# Patient Record
Sex: Female | Born: 1950 | Race: White | Hispanic: No
Health system: Southern US, Community
[De-identification: ages and names within clinical notes are randomized; demographics above are authoritative.]

---

## 2017-04-18 DIAGNOSIS — S32592A Other specified fracture of left pubis, initial encounter for closed fracture: Secondary | ICD-10-CM

## 2017-04-18 DIAGNOSIS — W19XXXA Unspecified fall, initial encounter: Secondary | ICD-10-CM

## 2017-04-19 DIAGNOSIS — Z720 Tobacco use: Secondary | ICD-10-CM

## 2017-04-24 DIAGNOSIS — Z720 Tobacco use: Secondary | ICD-10-CM | POA: Diagnosis not present

## 2017-04-24 DIAGNOSIS — S32592A Other specified fracture of left pubis, initial encounter for closed fracture: Secondary | ICD-10-CM | POA: Diagnosis not present

## 2017-04-24 DIAGNOSIS — W19XXXA Unspecified fall, initial encounter: Secondary | ICD-10-CM | POA: Diagnosis not present

## 2017-04-27 DIAGNOSIS — J449 Chronic obstructive pulmonary disease, unspecified: Secondary | ICD-10-CM | POA: Diagnosis not present

## 2017-04-27 DIAGNOSIS — R262 Difficulty in walking, not elsewhere classified: Secondary | ICD-10-CM

## 2017-04-27 DIAGNOSIS — N390 Urinary tract infection, site not specified: Secondary | ICD-10-CM

## 2017-04-27 DIAGNOSIS — S32592A Other specified fracture of left pubis, initial encounter for closed fracture: Secondary | ICD-10-CM | POA: Diagnosis not present

## 2017-04-27 DIAGNOSIS — Z720 Tobacco use: Secondary | ICD-10-CM | POA: Diagnosis not present

## 2017-04-27 DIAGNOSIS — S329XXA Fracture of unspecified parts of lumbosacral spine and pelvis, initial encounter for closed fracture: Secondary | ICD-10-CM

## 2017-04-28 DIAGNOSIS — Z720 Tobacco use: Secondary | ICD-10-CM | POA: Diagnosis not present

## 2017-04-28 DIAGNOSIS — S32592A Other specified fracture of left pubis, initial encounter for closed fracture: Secondary | ICD-10-CM | POA: Diagnosis not present

## 2017-04-28 DIAGNOSIS — S329XXA Fracture of unspecified parts of lumbosacral spine and pelvis, initial encounter for closed fracture: Secondary | ICD-10-CM | POA: Diagnosis not present

## 2017-04-28 DIAGNOSIS — J449 Chronic obstructive pulmonary disease, unspecified: Secondary | ICD-10-CM | POA: Diagnosis not present

## 2017-04-28 DIAGNOSIS — R262 Difficulty in walking, not elsewhere classified: Secondary | ICD-10-CM | POA: Diagnosis not present

## 2017-04-28 DIAGNOSIS — N390 Urinary tract infection, site not specified: Secondary | ICD-10-CM | POA: Diagnosis not present

## 2017-04-29 DIAGNOSIS — J449 Chronic obstructive pulmonary disease, unspecified: Secondary | ICD-10-CM | POA: Diagnosis not present

## 2017-04-29 DIAGNOSIS — R262 Difficulty in walking, not elsewhere classified: Secondary | ICD-10-CM | POA: Diagnosis not present

## 2017-04-29 DIAGNOSIS — N390 Urinary tract infection, site not specified: Secondary | ICD-10-CM | POA: Diagnosis not present

## 2017-04-29 DIAGNOSIS — S329XXA Fracture of unspecified parts of lumbosacral spine and pelvis, initial encounter for closed fracture: Secondary | ICD-10-CM | POA: Diagnosis not present

## 2017-04-30 DIAGNOSIS — R262 Difficulty in walking, not elsewhere classified: Secondary | ICD-10-CM | POA: Diagnosis not present

## 2017-04-30 DIAGNOSIS — S329XXA Fracture of unspecified parts of lumbosacral spine and pelvis, initial encounter for closed fracture: Secondary | ICD-10-CM | POA: Diagnosis not present

## 2017-04-30 DIAGNOSIS — J449 Chronic obstructive pulmonary disease, unspecified: Secondary | ICD-10-CM | POA: Diagnosis not present

## 2017-04-30 DIAGNOSIS — N390 Urinary tract infection, site not specified: Secondary | ICD-10-CM | POA: Diagnosis not present

## 2017-05-01 DIAGNOSIS — R262 Difficulty in walking, not elsewhere classified: Secondary | ICD-10-CM | POA: Diagnosis not present

## 2017-05-01 DIAGNOSIS — S329XXA Fracture of unspecified parts of lumbosacral spine and pelvis, initial encounter for closed fracture: Secondary | ICD-10-CM | POA: Diagnosis not present

## 2017-05-01 DIAGNOSIS — N390 Urinary tract infection, site not specified: Secondary | ICD-10-CM | POA: Diagnosis not present

## 2017-05-01 DIAGNOSIS — J449 Chronic obstructive pulmonary disease, unspecified: Secondary | ICD-10-CM | POA: Diagnosis not present

## 2017-05-02 DIAGNOSIS — S329XXA Fracture of unspecified parts of lumbosacral spine and pelvis, initial encounter for closed fracture: Secondary | ICD-10-CM | POA: Diagnosis not present

## 2017-05-02 DIAGNOSIS — N390 Urinary tract infection, site not specified: Secondary | ICD-10-CM | POA: Diagnosis not present

## 2017-05-02 DIAGNOSIS — J449 Chronic obstructive pulmonary disease, unspecified: Secondary | ICD-10-CM | POA: Diagnosis not present

## 2017-05-02 DIAGNOSIS — R262 Difficulty in walking, not elsewhere classified: Secondary | ICD-10-CM | POA: Diagnosis not present

## 2017-05-24 DIAGNOSIS — N179 Acute kidney failure, unspecified: Secondary | ICD-10-CM

## 2017-05-24 DIAGNOSIS — S329XXA Fracture of unspecified parts of lumbosacral spine and pelvis, initial encounter for closed fracture: Secondary | ICD-10-CM

## 2017-05-24 DIAGNOSIS — E860 Dehydration: Secondary | ICD-10-CM | POA: Diagnosis not present

## 2017-05-24 DIAGNOSIS — J449 Chronic obstructive pulmonary disease, unspecified: Secondary | ICD-10-CM | POA: Diagnosis not present

## 2017-05-25 DIAGNOSIS — S329XXA Fracture of unspecified parts of lumbosacral spine and pelvis, initial encounter for closed fracture: Secondary | ICD-10-CM | POA: Diagnosis not present

## 2017-05-25 DIAGNOSIS — J449 Chronic obstructive pulmonary disease, unspecified: Secondary | ICD-10-CM | POA: Diagnosis not present

## 2017-05-25 DIAGNOSIS — E860 Dehydration: Secondary | ICD-10-CM | POA: Diagnosis not present

## 2017-05-25 DIAGNOSIS — I6789 Other cerebrovascular disease: Secondary | ICD-10-CM

## 2017-05-25 DIAGNOSIS — N179 Acute kidney failure, unspecified: Secondary | ICD-10-CM | POA: Diagnosis not present

## 2017-05-26 ENCOUNTER — Inpatient Hospital Stay (HOSPITAL_COMMUNITY)
Admission: AD | Admit: 2017-05-26 | Discharge: 2017-06-08 | DRG: 064 | Disposition: E | Payer: Medicaid Other | Source: Other Acute Inpatient Hospital | Attending: Family Medicine | Admitting: Family Medicine

## 2017-05-26 ENCOUNTER — Inpatient Hospital Stay (HOSPITAL_COMMUNITY): Payer: Medicaid Other

## 2017-05-26 DIAGNOSIS — A419 Sepsis, unspecified organism: Secondary | ICD-10-CM | POA: Diagnosis not present

## 2017-05-26 DIAGNOSIS — B182 Chronic viral hepatitis C: Secondary | ICD-10-CM | POA: Diagnosis not present

## 2017-05-26 DIAGNOSIS — J9600 Acute respiratory failure, unspecified whether with hypoxia or hypercapnia: Secondary | ICD-10-CM | POA: Diagnosis not present

## 2017-05-26 DIAGNOSIS — J690 Pneumonitis due to inhalation of food and vomit: Secondary | ICD-10-CM | POA: Diagnosis present

## 2017-05-26 DIAGNOSIS — E440 Moderate protein-calorie malnutrition: Secondary | ICD-10-CM | POA: Diagnosis present

## 2017-05-26 DIAGNOSIS — J9601 Acute respiratory failure with hypoxia: Secondary | ICD-10-CM | POA: Diagnosis not present

## 2017-05-26 DIAGNOSIS — R6521 Severe sepsis with septic shock: Secondary | ICD-10-CM | POA: Diagnosis not present

## 2017-05-26 DIAGNOSIS — I6340 Cerebral infarction due to embolism of unspecified cerebral artery: Principal | ICD-10-CM | POA: Diagnosis present

## 2017-05-26 DIAGNOSIS — Z9911 Dependence on respirator [ventilator] status: Secondary | ICD-10-CM

## 2017-05-26 DIAGNOSIS — I330 Acute and subacute infective endocarditis: Secondary | ICD-10-CM | POA: Diagnosis present

## 2017-05-26 DIAGNOSIS — I76 Septic arterial embolism: Secondary | ICD-10-CM | POA: Diagnosis present

## 2017-05-26 DIAGNOSIS — I059 Rheumatic mitral valve disease, unspecified: Secondary | ICD-10-CM | POA: Diagnosis not present

## 2017-05-26 DIAGNOSIS — E876 Hypokalemia: Secondary | ICD-10-CM | POA: Diagnosis not present

## 2017-05-26 DIAGNOSIS — I669 Occlusion and stenosis of unspecified cerebral artery: Secondary | ICD-10-CM | POA: Diagnosis not present

## 2017-05-26 DIAGNOSIS — R29724 NIHSS score 24: Secondary | ICD-10-CM | POA: Diagnosis present

## 2017-05-26 DIAGNOSIS — T17990A Other foreign object in respiratory tract, part unspecified in causing asphyxiation, initial encounter: Secondary | ICD-10-CM | POA: Diagnosis present

## 2017-05-26 DIAGNOSIS — N179 Acute kidney failure, unspecified: Secondary | ICD-10-CM | POA: Diagnosis present

## 2017-05-26 DIAGNOSIS — E872 Acidosis: Secondary | ICD-10-CM | POA: Diagnosis not present

## 2017-05-26 DIAGNOSIS — I6310 Cerebral infarction due to embolism of unspecified precerebral artery: Secondary | ICD-10-CM | POA: Diagnosis not present

## 2017-05-26 DIAGNOSIS — I639 Cerebral infarction, unspecified: Secondary | ICD-10-CM | POA: Diagnosis not present

## 2017-05-26 DIAGNOSIS — E46 Unspecified protein-calorie malnutrition: Secondary | ICD-10-CM | POA: Diagnosis not present

## 2017-05-26 DIAGNOSIS — K219 Gastro-esophageal reflux disease without esophagitis: Secondary | ICD-10-CM | POA: Diagnosis not present

## 2017-05-26 DIAGNOSIS — S329XXA Fracture of unspecified parts of lumbosacral spine and pelvis, initial encounter for closed fracture: Secondary | ICD-10-CM | POA: Diagnosis not present

## 2017-05-26 DIAGNOSIS — K7460 Unspecified cirrhosis of liver: Secondary | ICD-10-CM | POA: Diagnosis not present

## 2017-05-26 DIAGNOSIS — K567 Ileus, unspecified: Secondary | ICD-10-CM | POA: Diagnosis not present

## 2017-05-26 DIAGNOSIS — B9561 Methicillin susceptible Staphylococcus aureus infection as the cause of diseases classified elsewhere: Secondary | ICD-10-CM | POA: Diagnosis not present

## 2017-05-26 DIAGNOSIS — R64 Cachexia: Secondary | ICD-10-CM | POA: Diagnosis not present

## 2017-05-26 DIAGNOSIS — J9690 Respiratory failure, unspecified, unspecified whether with hypoxia or hypercapnia: Secondary | ICD-10-CM

## 2017-05-26 DIAGNOSIS — D649 Anemia, unspecified: Secondary | ICD-10-CM | POA: Diagnosis not present

## 2017-05-26 DIAGNOSIS — G8191 Hemiplegia, unspecified affecting right dominant side: Secondary | ICD-10-CM

## 2017-05-26 DIAGNOSIS — J449 Chronic obstructive pulmonary disease, unspecified: Secondary | ICD-10-CM | POA: Diagnosis present

## 2017-05-26 DIAGNOSIS — Z66 Do not resuscitate: Secondary | ICD-10-CM | POA: Diagnosis present

## 2017-05-26 DIAGNOSIS — I38 Endocarditis, valve unspecified: Secondary | ICD-10-CM

## 2017-05-26 DIAGNOSIS — A4101 Sepsis due to Methicillin susceptible Staphylococcus aureus: Secondary | ICD-10-CM

## 2017-05-26 DIAGNOSIS — D696 Thrombocytopenia, unspecified: Secondary | ICD-10-CM

## 2017-05-26 DIAGNOSIS — Z515 Encounter for palliative care: Secondary | ICD-10-CM | POA: Diagnosis present

## 2017-05-26 DIAGNOSIS — R140 Abdominal distension (gaseous): Secondary | ICD-10-CM

## 2017-05-26 DIAGNOSIS — R233 Spontaneous ecchymoses: Secondary | ICD-10-CM | POA: Diagnosis not present

## 2017-05-26 DIAGNOSIS — R7881 Bacteremia: Secondary | ICD-10-CM | POA: Diagnosis not present

## 2017-05-26 DIAGNOSIS — I6789 Other cerebrovascular disease: Secondary | ICD-10-CM | POA: Diagnosis not present

## 2017-05-26 DIAGNOSIS — B1920 Unspecified viral hepatitis C without hepatic coma: Secondary | ICD-10-CM | POA: Diagnosis not present

## 2017-05-26 DIAGNOSIS — R609 Edema, unspecified: Secondary | ICD-10-CM | POA: Diagnosis not present

## 2017-05-26 DIAGNOSIS — J189 Pneumonia, unspecified organism: Secondary | ICD-10-CM | POA: Diagnosis not present

## 2017-05-26 DIAGNOSIS — Y95 Nosocomial condition: Secondary | ICD-10-CM | POA: Diagnosis present

## 2017-05-26 DIAGNOSIS — R931 Abnormal findings on diagnostic imaging of heart and coronary circulation: Secondary | ICD-10-CM | POA: Diagnosis not present

## 2017-05-26 DIAGNOSIS — I10 Essential (primary) hypertension: Secondary | ICD-10-CM | POA: Diagnosis present

## 2017-05-26 DIAGNOSIS — E860 Dehydration: Secondary | ICD-10-CM | POA: Diagnosis present

## 2017-05-26 DIAGNOSIS — D72829 Elevated white blood cell count, unspecified: Secondary | ICD-10-CM

## 2017-05-26 DIAGNOSIS — G9341 Metabolic encephalopathy: Secondary | ICD-10-CM | POA: Diagnosis present

## 2017-05-26 DIAGNOSIS — Z6822 Body mass index (BMI) 22.0-22.9, adult: Secondary | ICD-10-CM

## 2017-05-26 DIAGNOSIS — R6520 Severe sepsis without septic shock: Secondary | ICD-10-CM

## 2017-05-26 LAB — PREPARE RBC (CROSSMATCH)

## 2017-05-26 LAB — CBC WITH DIFFERENTIAL/PLATELET
Basophils Absolute: 0 10*3/uL (ref 0.0–0.1)
Basophils Relative: 0 %
EOS ABS: 0 10*3/uL (ref 0.0–0.7)
EOS PCT: 0 %
HCT: 20 % — ABNORMAL LOW (ref 36.0–46.0)
HEMOGLOBIN: 6.4 g/dL — AB (ref 12.0–15.0)
LYMPHS ABS: 1.4 10*3/uL (ref 0.7–4.0)
Lymphocytes Relative: 6 %
MCH: 26.8 pg (ref 26.0–34.0)
MCHC: 32 g/dL (ref 30.0–36.0)
MCV: 83.7 fL (ref 78.0–100.0)
Monocytes Absolute: 1.6 10*3/uL — ABNORMAL HIGH (ref 0.1–1.0)
Monocytes Relative: 7 %
NEUTROS ABS: 19.7 10*3/uL — AB (ref 1.7–7.7)
NRBC: 1 /100{WBCs} — AB
Neutrophils Relative %: 87 %
Platelets: 33 10*3/uL — ABNORMAL LOW (ref 150–400)
RBC: 2.39 MIL/uL — AB (ref 3.87–5.11)
RDW: 28.7 % — ABNORMAL HIGH (ref 11.5–15.5)
WBC: 22.7 10*3/uL — AB (ref 4.0–10.5)

## 2017-05-26 LAB — COMPREHENSIVE METABOLIC PANEL
ALT: 25 U/L (ref 14–54)
ANION GAP: 6 (ref 5–15)
AST: 48 U/L — ABNORMAL HIGH (ref 15–41)
Albumin: 1.5 g/dL — ABNORMAL LOW (ref 3.5–5.0)
Alkaline Phosphatase: 129 U/L — ABNORMAL HIGH (ref 38–126)
BUN: 54 mg/dL — ABNORMAL HIGH (ref 6–20)
CHLORIDE: 118 mmol/L — AB (ref 101–111)
CO2: 13 mmol/L — AB (ref 22–32)
Calcium: 7.5 mg/dL — ABNORMAL LOW (ref 8.9–10.3)
Creatinine, Ser: 1.4 mg/dL — ABNORMAL HIGH (ref 0.44–1.00)
GFR, EST AFRICAN AMERICAN: 45 mL/min — AB (ref >60)
GFR, EST NON AFRICAN AMERICAN: 39 mL/min — AB (ref >60)
Glucose, Bld: 95 mg/dL (ref 65–99)
Potassium: 3.5 mmol/L (ref 3.5–5.1)
SODIUM: 137 mmol/L (ref 135–145)
Total Bilirubin: 1.1 mg/dL (ref 0.3–1.2)
Total Protein: 5.4 g/dL — ABNORMAL LOW (ref 6.5–8.1)

## 2017-05-26 LAB — BLOOD GAS, ARTERIAL
Acid-base deficit: 11.2 mmol/L — ABNORMAL HIGH (ref 0.0–2.0)
BICARBONATE: 14.2 mmol/L — AB (ref 20.0–28.0)
Drawn by: 519031
FIO2: 40
O2 Saturation: 95.4 %
PCO2 ART: 31.3 mmHg — AB (ref 32.0–48.0)
PEEP: 5 cmH2O
Patient temperature: 98.6
RATE: 18 resp/min
VT: 500 mL
pH, Arterial: 7.278 — ABNORMAL LOW (ref 7.350–7.450)
pO2, Arterial: 88.7 mmHg (ref 83.0–108.0)

## 2017-05-26 LAB — PROTIME-INR
INR: 1.85
PROTHROMBIN TIME: 21.2 s — AB (ref 11.4–15.2)

## 2017-05-26 LAB — TRIGLYCERIDES: Triglycerides: 124 mg/dL (ref <150)

## 2017-05-26 LAB — LIPID PANEL
CHOLESTEROL: 51 mg/dL (ref 0–200)
HDL: <10 mg/dL — ABNORMAL LOW (ref >40)
TRIGLYCERIDES: 157 mg/dL — AB (ref <150)
VLDL: 31 mg/dL (ref 0–40)

## 2017-05-26 LAB — ABO/RH: ABO/RH(D): O POS

## 2017-05-26 LAB — URINALYSIS, ROUTINE W REFLEX MICROSCOPIC
BILIRUBIN URINE: NEGATIVE
Glucose, UA: NEGATIVE mg/dL
KETONES UR: NEGATIVE mg/dL
NITRITE: NEGATIVE
PROTEIN: NEGATIVE mg/dL
Specific Gravity, Urine: 1.013 (ref 1.005–1.030)
pH: 5 (ref 5.0–8.0)

## 2017-05-26 LAB — MRSA PCR SCREENING: MRSA by PCR: NEGATIVE

## 2017-05-26 LAB — LACTIC ACID, PLASMA
LACTIC ACID, VENOUS: 0.9 mmol/L (ref 0.5–1.9)
Lactic Acid, Venous: 0.8 mmol/L (ref 0.5–1.9)

## 2017-05-26 LAB — MAGNESIUM: MAGNESIUM: 1.6 mg/dL — AB (ref 1.7–2.4)

## 2017-05-26 LAB — PROCALCITONIN: PROCALCITONIN: 11.49 ng/mL

## 2017-05-26 LAB — FIBRINOGEN: Fibrinogen: 428 mg/dL (ref 210–475)

## 2017-05-26 LAB — PHOSPHORUS: PHOSPHORUS: 3.4 mg/dL (ref 2.5–4.6)

## 2017-05-26 MED ORDER — PIPERACILLIN-TAZOBACTAM 3.375 G IVPB
3.3750 g | Freq: Three times a day (TID) | INTRAVENOUS | Status: DC
  Administered 2017-05-26 – 2017-05-28 (×4): 3.375 g via INTRAVENOUS
  Filled 2017-05-26 (×6): qty 50

## 2017-05-26 MED ORDER — SODIUM CHLORIDE 0.9 % IV SOLN
Freq: Once | INTRAVENOUS | Status: AC
  Administered 2017-05-26: 20:00:00 via INTRAVENOUS

## 2017-05-26 MED ORDER — VANCOMYCIN HCL IN DEXTROSE 1-5 GM/200ML-% IV SOLN
1000.0000 mg | Freq: Once | INTRAVENOUS | Status: AC
  Administered 2017-05-26: 1000 mg via INTRAVENOUS
  Filled 2017-05-26: qty 200

## 2017-05-26 MED ORDER — SODIUM CHLORIDE 0.9 % IV SOLN
INTRAVENOUS | Status: DC
  Administered 2017-05-26: 17:00:00 via INTRAVENOUS

## 2017-05-26 MED ORDER — ORAL CARE MOUTH RINSE
15.0000 mL | Freq: Four times a day (QID) | OROMUCOSAL | Status: DC
  Administered 2017-05-26 – 2017-05-29 (×11): 15 mL via OROMUCOSAL

## 2017-05-26 MED ORDER — FENTANYL CITRATE (PF) 100 MCG/2ML IJ SOLN
100.0000 ug | INTRAMUSCULAR | Status: DC | PRN
  Administered 2017-05-28: 100 ug via INTRAVENOUS
  Administered 2017-05-29: 50 ug via INTRAVENOUS
  Filled 2017-05-26: qty 2

## 2017-05-26 MED ORDER — PANTOPRAZOLE SODIUM 40 MG IV SOLR
40.0000 mg | Freq: Every day | INTRAVENOUS | Status: DC
  Administered 2017-05-26: 40 mg via INTRAVENOUS
  Filled 2017-05-26: qty 40

## 2017-05-26 MED ORDER — MAGNESIUM SULFATE 2 GM/50ML IV SOLN
2.0000 g | Freq: Once | INTRAVENOUS | Status: AC
  Administered 2017-05-27: 2 g via INTRAVENOUS
  Filled 2017-05-26: qty 50

## 2017-05-26 MED ORDER — VANCOMYCIN HCL IN DEXTROSE 750-5 MG/150ML-% IV SOLN
750.0000 mg | INTRAVENOUS | Status: DC
  Administered 2017-05-27: 750 mg via INTRAVENOUS
  Filled 2017-05-26: qty 150

## 2017-05-26 MED ORDER — FENTANYL CITRATE (PF) 100 MCG/2ML IJ SOLN
100.0000 ug | INTRAMUSCULAR | Status: DC | PRN
  Administered 2017-05-27: 50 ug via INTRAVENOUS
  Filled 2017-05-26 (×2): qty 2

## 2017-05-26 MED ORDER — PROPOFOL 1000 MG/100ML IV EMUL
0.0000 ug/kg/min | INTRAVENOUS | Status: DC
  Administered 2017-05-26: 10 ug/kg/min via INTRAVENOUS
  Administered 2017-05-26: 15 ug/kg/min via INTRAVENOUS
  Administered 2017-05-27 (×2): 25 ug/kg/min via INTRAVENOUS
  Administered 2017-05-28: 15 ug/kg/min via INTRAVENOUS
  Administered 2017-05-28: 29.898 ug/kg/min via INTRAVENOUS
  Administered 2017-05-29: 35 ug/kg/min via INTRAVENOUS
  Administered 2017-05-29: 30 ug/kg/min via INTRAVENOUS
  Administered 2017-05-30: 20 ug/kg/min via INTRAVENOUS
  Administered 2017-05-30: 30 ug/kg/min via INTRAVENOUS
  Administered 2017-05-30: 35 ug/kg/min via INTRAVENOUS
  Filled 2017-05-26 (×8): qty 100

## 2017-05-26 MED ORDER — CHLORHEXIDINE GLUCONATE 0.12% ORAL RINSE (MEDLINE KIT)
15.0000 mL | Freq: Two times a day (BID) | OROMUCOSAL | Status: DC
  Administered 2017-05-26 – 2017-05-29 (×6): 15 mL via OROMUCOSAL

## 2017-05-26 MED ORDER — DEXTROSE 5 % IV SOLN
INTRAVENOUS | Status: DC
  Administered 2017-05-26 – 2017-05-27 (×2): via INTRAVENOUS
  Filled 2017-05-26 (×4): qty 150

## 2017-05-26 MED ORDER — IPRATROPIUM-ALBUTEROL 0.5-2.5 (3) MG/3ML IN SOLN
3.0000 mL | Freq: Four times a day (QID) | RESPIRATORY_TRACT | Status: DC
  Administered 2017-05-26 – 2017-05-31 (×20): 3 mL via RESPIRATORY_TRACT
  Filled 2017-05-26 (×19): qty 3

## 2017-05-26 MED ORDER — SODIUM CHLORIDE 0.9 % IV SOLN: 250.0000 mL | INTRAVENOUS | Status: DC | PRN

## 2017-05-26 NOTE — Consult Note (Addendum)
Neurology Consultation  Reason for Consult: Stroke Referring Physician: Dr. Franciso Bend  CC: Right-sided weakness, right facial droop, stroke on MRI  History is obtained from: Paper chart, imaging reviewed on PACS  HPI: Yvonne Fields is a 66 y.o. female who has a past medical history of hepatitis C, congestive heart failure, COPD, who presented to [facility] on 10/17 and was admitted for nausea vomiting diarrhea and acute renal failure along with leukocytosis and lactic acidosis. On the following morning, on 05/25/2017, she was observed to have right-sided facial weakness and a code stroke was activated at that hospital.  She was evaluated by telemetry neurology, who evaluated the patient in the internal medicine but did not deem her a candidate for IV TPA because of thrombocytopenia.  Further imaging was recommended.  An MRI of the brain was obtained that showed embolic-looking infarcts in both cerebral hemispheres.  Head and neck vessel imaging was unremarkable for source.  Transthoracic echo was done that revealed a echogenicity below the mitral valve.  A TEE was done.  Differentials for the mass include tumor versus infection. Patient was transferred to [facility] for higher level of care.  After the D was done, patient had respiratory compromise and needed to be intubated.  Currently at this time, patient is intubated and on propofol.  She was on fentanyl up until less than an hour ago. All the paperwork from [facility] is in the paper chart. Images are available on PACS but no reports are available in the electronic medical record.  LKW: 05/24/2017 tpa given?: no, code stroke was an outside hospital, transferred to [facility] is for predominantly anomaly found on echocardiogram in the mitral valve.  Way outside the window for intervention Premorbid modified Rankin scale (mRS):  Unable to ascertain from the records. No family at bedside yet.  ROS: Unable to obtain due to  altered mental status.   No past medical history on file. Cirrhosis, Hep C, COPD per review of paper chart  No family history on file. Unable to ascertain due to mentation  Social History:   has no tobacco, alcohol, and drug history on file.  Medications  Current Facility-Administered Medications:  .  0.9 %  sodium chloride infusion, 250 mL, Intravenous, PRN, Corey Harold, NP .  0.9 %  sodium chloride infusion, , Intravenous, Continuous, Corey Harold, NP, Last Rate: 50 mL/hr at 06/02/2017 1721 .  chlorhexidine gluconate (MEDLINE KIT) (PERIDEX) 0.12 % solution 15 mL, 15 mL, Mouth Rinse, BID, Hoffman, Silvestre Moment, NP .  fentaNYL (SUBLIMAZE) injection 100 mcg, 100 mcg, Intravenous, Q15 min PRN, Corey Harold, NP .  fentaNYL (SUBLIMAZE) injection 100 mcg, 100 mcg, Intravenous, Q2H PRN, Corey Harold, NP .  ipratropium-albuterol (DUONEB) 0.5-2.5 (3) MG/3ML nebulizer solution 3 mL, 3 mL, Nebulization, Q6H, Corey Harold, NP .  Derrill Memo ON 05/27/2017] MEDLINE mouth rinse, 15 mL, Mouth Rinse, QID, Corey Harold, NP .  pantoprazole (PROTONIX) injection 40 mg, 40 mg, Intravenous, QHS, Corey Harold, NP .  propofol (DIPRIVAN) 1000 MG/100ML infusion, 0-50 mcg/kg/min, Intravenous, Continuous, Corey Harold, NP, Last Rate: 3.1 mL/hr at 06/06/2017 1725, 10 mcg/kg/min at 06/06/2017 1725   Exam: Current vital signs: BP (!) 115/49   Pulse (!) 108   Resp 16   Ht _0  (1.6 m)   Wt 52.4 kg (115 lb 8.3 oz)   SpO2 93%   BMI 20.46 kg/m  Vital signs in last 24 hours: Pulse Rate:  [108-125] 108 (  10/19 1700) Resp:  [16-28] 16 (10/19 1700) BP: (109-115)/(46-49) 115/49 (10/19 1700) SpO2:  [93 %-100 %] 93 % (10/19 1700) FiO2 (%):  [40 %] 40 % (10/19 1610) Weight:  [52.4 kg (115 lb 8.3 oz)] 52.4 kg (115 lb 8.3 oz) (10/19 1700) General: Sedated intubated HEENT: Normocephalic, atraumatic, dried blood on the oral cavity, intubated, but no palpable lymphadenopathy CVS: S1-S2 heard, regular rate  rhythm Respiratory: Clear to auscultation Extremities: Warm well perfused Neurological exam Sedated and intubated. Opens eyes to loud voice. Does not follow any commands Pupils equal round reactive to light, no gaze deviation but prefers to look left.  Unclear if this is secondary to inattention.  No obvious facial droop noted but exam limited by the positioning of endotracheal tube. Motor exam: Withdraws both lower extremities to noxious stim.  Withdraws both upper extremities to noxious stimulus. Sensory as above Cerebellar exam and gait exam could not be performed due to patient's mentation. NIHSS 1a Level of Conscious.: 2 1b LOC Questions: 2 1c LOC Commands: 2 2 Best Gaze: 1 3 Visual: 0 4 Facial Palsy: 0 5a Motor Arm - left: 3 5b Motor Arm - Right: 3 6a Motor Leg - Left: 3 6b Motor Leg - Right: 3 7 Limb Ataxia: 0 8 Sensory: 0 9 Best Language: 3 10 Dysarthria: 2 11 Extinct. and Inatten.: 0 TOTAL: 24  Labs I have reviewed labs in epic and the results pertinent to this consultation are:  CBC    Component Value Date/Time   WBC PENDING 05/21/2017 1629   RBC 2.39 (L) 05/14/2017 1629   HGB 6.4 (LL) 05/21/2017 1629   HCT 20.0 (L) 06/02/2017 1629   PLT PENDING 05/17/2017 1629   MCV 83.7 05/23/2017 1629   MCH 26.8 05/15/2017 1629   MCHC 32.0 06/06/2017 1629   RDW 28.7 (H) 06/03/2017 1629   LYMPHSABS PENDING 05/17/2017 1629   MONOABS PENDING 05/15/2017 1629   EOSABS PENDING 05/18/2017 1629   BASOSABS PENDING 05/22/2017 1629   Outside hospital chart shows platelet count of 38,000 from this morning. [facility]  labs pending  CMP  No results found for: NA, K, CL, CO2, GLUCOSE, BUN, CREATININE, CALCIUM, PROT, ALBUMIN, AST, ALT, ALKPHOS, BILITOT, GFRNONAA, GFRAA  Lipid Panel  No results found for: CHOL, TRIG, HDL, CHOLHDL, VLDL, LDLCALC, LDLDIRECT   Imaging I have reviewed the images obtained: MRI examination of the brain - shows strokes in bilateral cerebral hemispheres  -right occipital, left parietal, left internal capsule, right coronary radiata, and possibly right frontal cortex. Intracranial MRA was read as negative.  Carotid Dopplers did not show significant stenosis.  Transesophageal echocardiogram was performed that showed "mild left atrial dilatation, mild to moderate mitral regurgitation, cannot rule out vegetation, left ventricle ejection fraction 50-55%, heterogenous density seen on the posterior mitral valve on the atrial surface at least 1.5 x 1.5 cm suggestive of vegetation versus atrial myxoma versus fibroblastoma versus nonorganized thrombus masquerading is heterogeneous density"  Assessment:  66 year old woman with a past history of cirrhosis, hepatitis C, congestive heart failure and COPD, who was seen at an outside hospital for nausea vomiting diarrhea.,  Developed sudden onset of right facial weakness, prompting stroke workup.  MRI of the brain showed bilateral cerebral hemisphere strokes-suspicious for embolic etiology.  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 Most likely origin of her strokes is cardio embolic from the mitral valve vegetation. Most recent platelet count was  38,000.  Recommendations -Eval of the cardiac mass v vegetation - per primary team and CT surg. -To complete the stroke risk factory w/u, I would recommend getting lipid panel and hemoglobin A 1C, lipid panel. -Not sure if blood c/s were drawn prior to initiating antibiotics. Pharmacy trying to ascertain that info too. If that was not drawn prior to abx, doubt a draw now will be useful. -Unless she has a neurological exam change, I do not see a point in repeating the  MRI or head/neck vessel imaging. -For now, maintain her on Aspirin. In the future, might need anticoagulation, but that will depend on clinical course and what the  mass/vegetation on the mitral valve is. Stroke team will follow with you in the AM.  -- Amie Portland, MD [facility] [phone] If 7pm to 7am, please call on call as listed on AMION.  ADDENDUM  [facility] Pharmacist was able to talk to pharmacist at OSH to gather more info. Appreciate the call. Levaquin and flagyl were given to the patient prior to stroke symptoms being noted. After TEE, when plan was to initiate broad spectrum abx but that was not started at [place], and pt was transferred to [facility] ICU. For the cultures: Urine c/s 10/17 negative Blood c/s 10/18 negative Bronch c/s 19th - pending Labs - Total chol - <50. LDL non reportable. A1c 5.2  No need to repeat A1c. Can repeat lipid panel.  Amie Portland, MD [facility] [phone] If 7pm to 7am, please call on call as listed on AMION.

## 2017-05-26 NOTE — H&P (Signed)
PULMONARY / CRITICAL CARE MEDICINE   Name: Yvonne Fields MRN: [id number] DOB: May 08, 1951    ADMISSION DATE:  06/06/2017 CONSULTATION DATE:  06/04/2017  REFERRING MD:  Dr. Francia GreavesGoodrich, [place] hospitalist  CHIEF COMPLAINT:  Embolic stroke  HISTORY OF PRESENT ILLNESS:   66 year old female with past medical history significant for COPD, GERD, hepatitis C, congestive heart failure, and recent urinary tract infection. Also pubic rami fracture diagnosed September of this year. She presented to [facility] on 10/17 with complaints of nausea, vomiting, diarrhea, and abdominal pain 10 days. She was admitted for dehydration and associated acute kidney injury. The following day she awoke from sleep with right-sided facial droop and associated weakness. Code stroke was activated, it was felt as though she had suffered an ischemic event. She was not given TPA due to unknown time of last no well and INR greater than 2. MRI of the brain revealed multiple areas of infarction consistent with embolic disease. 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 After the TEE was hypoxemic requiring intubation.Chest x-ray at that time demonstrated left lung collapse. She underwent a bronchoscopy during which copious secretions were removed including some plugs. She was transferred to [facility] for surgical consultation.  PAST MEDICAL HISTORY :  She  has no past medical history on file.  PAST SURGICAL HISTORY: She  has no past surgical history on file.  No Known Allergies  No current facility-administered medications on file prior to encounter.    No current outpatient prescriptions on file prior to encounter.    FAMILY HISTORY:  Her has no family status information on file.    SOCIAL HISTORY: She    REVIEW OF SYSTEMS:   Unable as patient is intubated  SUBJECTIVE:    VITAL SIGNS: BP (!) 109/46 (BP Location: Left Arm)   Pulse (!) 108   Resp 16    SpO2 94%   HEMODYNAMICS:    VENTILATOR SETTINGS: Vent Mode: PRVC FiO2 (%):  [40 %] 40 % Set Rate:  [18 bmp] 18 bmp Vt Set:  [500 mL] 500 mL PEEP:  [8 cmH20] 8 cmH20 Plateau Pressure:  [18 cmH20] 18 cmH20  INTAKE / OUTPUT: No intake/output data recorded.  PHYSICAL EXAMINATION: General:  Frail adult-elderly female in NAD on vent Neuro:  Opens eyes to voice. Sedated. RASS -1 HEENT:  Flushing/AT, PERRL, no JVD. MM dry Cardiovascular:  RRR, no MRG. No edema.  Lungs:  Clear, bilateral breath sounds Abdomen:  Soft, non-tender, non-distended Musculoskeletal:  No acute deformity  Skin:  Grossly itnact  LABS:  BMET No results for input(s): NA, K, CL, CO2, BUN, CREATININE, GLUCOSE in the last 168 hours.  Electrolytes No results for input(s): CALCIUM, MG, PHOS in the last 168 hours.  CBC No results for input(s): WBC, HGB, HCT, PLT in the last 168 hours.  Coag's No results for input(s): APTT, INR in the last 168 hours.  Sepsis Markers No results for input(s): LATICACIDVEN, PROCALCITON, O2SATVEN in the last 168 hours.  ABG No results for input(s): PHART, PCO2ART, PO2ART in the last 168 hours.  Liver Enzymes No results for input(s): AST, ALT, ALKPHOS, BILITOT, ALBUMIN in the last 168 hours.  Cardiac Enzymes No results for input(s): TROPONINI, PROBNP in the last 168 hours.  Glucose No results for input(s): GLUCAP in the last 168 hours.  Imaging No results found.   STUDIES:  MRI Brain 10/18 > multiple areas bilateral infarct consistent with embolic disease Carotid ultrasound >  unremarkable Echo 10/18 > heterogeneous density on mitral valve suggestive of vegetation, myxoma, fibroelastoma, or organized thrombus.  TEE 10/19 > LVEF 50-55%, mild to mod mitral regurg unable to rule out veg.   CULTURES: Blood 10/19 > Sputum 10/19 >  ANTIBIOTICS: Zosyn 10/19> Vancomycin 10/19 >  SIGNIFICANT EVENTS: 10/17 admit for dehydration AKI 10/18 embolic CVA 10/19 ETT, intubated  for hypoxia, and transfer to [facility] for mitral valve mass.   LINES/TUBES: ETT 10/19 > PICC 10/19 >  DISCUSSION: 66 year old female admitted for dehydration 6/17 and unfortunately suffered CVA the following day. MRI demonstrates embolic disease. 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  ASSESSMENT / PLAN:  PULMONARY A: Acute hypoxemic respiratory failure - likely multifactorial with possible aspiration, mucous plugs. 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 Hypotension likely sedation related History of CHF, unspecified.    P:  Telemetry monitoring in ICU setting IVF resuscitation Will need CVTS, cardiology consultation.    RENAL A:   Acute kidney injury  P:   IVF Follow BMP  GASTROINTESTINAL A:   Nausea/vomiting/diarrhea x 10 days. ? If embolic disease to gut as well.  Cirrhosis (radiographic evidence) with known hepatitis C  P:   NPO PPI  HEMATOLOGIC A:   Anemia Thrombocytopenia - Acute on chronic  P:  CBC STAT Coags  INFECTIOUS A:   Presumed aspiration PNA - 48 hours into hospitalization so will treat as HCAP Mitral valve mass - fibroelastoma favored, but cannot rule out infective endocarditis.  Hepatitis C  P:   ABX as above Blood, sputum cultures Follow WBC and fever curve.  ENDOCRINE A:   No acute issues  P:   Follow glucose on chemistry  NEUROLOGIC A:   Acute CVA - multiple bilateral infarcts suggesting embolic disease.  Acute metabolic encephalopathy in setting of , CVA.   P:   RASS goal: -1 to -2 Propofol infusion PRN fentanyl Neurology consulted   FAMILY  - Updates:   - Inter-disciplinary family meet or Palliative Care meeting due by:  10/26     Joneen Roach, AGACNP-BC [place] [facility] Pager [phone] or [phone]  05/19/2017 5:42 PM

## 2017-05-26 NOTE — Progress Notes (Signed)
[facility] [place] [facility]. Pt switched to [facility] ventilator with no complications. VS within normal limits. Pt noted to have moderate amount of dried blood in her mouth. RN and MD made aware

## 2017-05-26 NOTE — Progress Notes (Signed)
Notified hemoglobin 6.4 and Magnesium 1.6. Type and Screen and one unit RBC, and 2 grams Mag ordered.  INR 1.85. PLT 33. PT 21.2.   Fibrogen Processing. CBC ordered for one hour post-transfusion. Bedside nurse reports no active bleeding.    Jovita KussmaulKatalina Arika Mainer, AGACNP-BC [facility]  Pgr: [phone]  PCCM Pgr: [phone]

## 2017-05-26 NOTE — Progress Notes (Signed)
Pharmacy Antibiotic Note  Yvonne GalleryJanice Marie Dorsi is a 66 y.o. 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 Patient became hypoxemic after TEE, intubated, and CXR with left lung collapse. Patient was transferred to [facility] on 05/16/2017 for surgical evaluation. Pharmacy has been consulted for Vancomycin and Zosyn dosing for HCAP as well as possible bacteremia/endocarditis.   Labs from [facility] 10[facility]19 : WBC 17, plts 38, SCr 1.90, K 4.5, Albumin 2.3, INR 2, A1c 5.2.   [facility] labs: SCr is now down to 1.40 with estimated CrCl 33.6 mL/min. Appears to be making urine. Lactate 0.9.  Note allergy to Pencillin is actually an intolerance of nausea and vomiting. Ok to receive broad spectrum antibiotics.   Plan: Vancomycin 1 g IV now, then 750 mg IV every 24 hours.  Zosyn 3.375 g IV every 8 hours - 4 hr infusion.  Monitor renal function, culture results, and clinical status.  Follow-up if culture results from [facility].    Height: 5\' 3"  (160 cm) Weight: 115 lb 8.3 oz (52.4 kg) IBW/kg (Calculated) : 52.4  No data recorded.  No results for input(s): WBC, CREATININE, LATICACIDVEN, VANCOTROUGH, VANCOPEAK, VANCORANDOM, GENTTROUGH, GENTPEAK, GENTRANDOM, TOBRATROUGH, TOBRAPEAK, TOBRARND, AMIKACINPEAK, AMIKACINTROU, AMIKACIN in the last 168 hours.  CrCl cannot be calculated (No order found.).    No Known Allergies  Antimicrobials at [facility]: Flagyl 10/18 >> 10/19 Levaquin 10/17 >> 10/19 *Per [facility] pharmacy, changed to Vancomycin and Zosyn but no doses received.   Antimicrobials this admission: Vancomycin 10/19 >> Zosyn 10/19 >>  Dose adjustments this admission:  Microbiology from [facility]: 10/17 Urine culture - negative 10/18 Blood cultures -  pending 10/19 BAL culture - pending   Microbiology results: 10/19 Blood: 10/19 Respiratory: 10/19 MRSA PCR: negative  Thank you for allowing pharmacy to be a part of this patient's care.  Link SnufferJessica Ramiah Helfrich, PharmD, BCPS Clinical Pharmacist Clinical phone 05/25/2017 until 11PM [phone]- #[phone] After hours, please call #[phone] 05/13/2017 5:49 PM

## 2017-05-27 ENCOUNTER — Inpatient Hospital Stay (HOSPITAL_COMMUNITY): Payer: Medicaid Other

## 2017-05-27 DIAGNOSIS — I639 Cerebral infarction, unspecified: Secondary | ICD-10-CM

## 2017-05-27 DIAGNOSIS — J9600 Acute respiratory failure, unspecified whether with hypoxia or hypercapnia: Secondary | ICD-10-CM

## 2017-05-27 DIAGNOSIS — N179 Acute kidney failure, unspecified: Secondary | ICD-10-CM

## 2017-05-27 LAB — BLOOD CULTURE ID PANEL (REFLEXED)
Acinetobacter baumannii: NOT DETECTED
CANDIDA ALBICANS: NOT DETECTED
CANDIDA GLABRATA: NOT DETECTED
Candida krusei: NOT DETECTED
Candida parapsilosis: NOT DETECTED
Candida tropicalis: NOT DETECTED
ENTEROBACTER CLOACAE COMPLEX: NOT DETECTED
ENTEROBACTERIACEAE SPECIES: NOT DETECTED
ENTEROCOCCUS SPECIES: NOT DETECTED
Escherichia coli: NOT DETECTED
Haemophilus influenzae: NOT DETECTED
KLEBSIELLA PNEUMONIAE: NOT DETECTED
Klebsiella oxytoca: NOT DETECTED
LISTERIA MONOCYTOGENES: NOT DETECTED
Methicillin resistance: NOT DETECTED
NEISSERIA MENINGITIDIS: NOT DETECTED
Proteus species: NOT DETECTED
Pseudomonas aeruginosa: NOT DETECTED
STREPTOCOCCUS AGALACTIAE: NOT DETECTED
STREPTOCOCCUS PNEUMONIAE: NOT DETECTED
STREPTOCOCCUS PYOGENES: NOT DETECTED
STREPTOCOCCUS SPECIES: NOT DETECTED
Serratia marcescens: NOT DETECTED
Staphylococcus aureus (BCID): DETECTED — AB
Staphylococcus species: DETECTED — AB

## 2017-05-27 LAB — TYPE AND SCREEN
ABO/RH(D): O POS
Antibody Screen: NEGATIVE
Unit division: 0

## 2017-05-27 LAB — BPAM RBC
Blood Product Expiration Date: 201811162359
ISSUE DATE / TIME: 201810192257
UNIT TYPE AND RH: 5100

## 2017-05-27 LAB — HEMOGLOBIN A1C
Hgb A1c MFr Bld: 5.3 % (ref 4.8–5.6)
MEAN PLASMA GLUCOSE: 105.41 mg/dL

## 2017-05-27 LAB — BLOOD GAS, ARTERIAL
ACID-BASE DEFICIT: 6.3 mmol/L — AB (ref 0.0–2.0)
BICARBONATE: 17.5 mmol/L — AB (ref 20.0–28.0)
Drawn by: 406621
FIO2: 40
MECHVT: 500 mL
O2 SAT: 92.6 %
PCO2 ART: 28.8 mmHg — AB (ref 32.0–48.0)
PEEP: 8 cmH2O
PO2 ART: 64.8 mmHg — AB (ref 83.0–108.0)
Patient temperature: 98.6
RATE: 18 resp/min
pH, Arterial: 7.402 (ref 7.350–7.450)

## 2017-05-27 LAB — BASIC METABOLIC PANEL
Anion gap: 10 (ref 5–15)
BUN: 50 mg/dL — AB (ref 6–20)
CALCIUM: 8 mg/dL — AB (ref 8.9–10.3)
CO2: 17 mmol/L — ABNORMAL LOW (ref 22–32)
Chloride: 111 mmol/L (ref 101–111)
Creatinine, Ser: 1.31 mg/dL — ABNORMAL HIGH (ref 0.44–1.00)
GFR calc Af Amer: 49 mL/min — ABNORMAL LOW (ref >60)
GFR, EST NON AFRICAN AMERICAN: 42 mL/min — AB (ref >60)
Glucose, Bld: 139 mg/dL — ABNORMAL HIGH (ref 65–99)
POTASSIUM: 3.5 mmol/L (ref 3.5–5.1)
SODIUM: 138 mmol/L (ref 135–145)

## 2017-05-27 LAB — CBC
HCT: 25.9 % — ABNORMAL LOW (ref 36.0–46.0)
HCT: 24.3 % — ABNORMAL LOW (ref 36.0–46.0)
HEMOGLOBIN: 8.1 g/dL — AB (ref 12.0–15.0)
Hemoglobin: 8.4 g/dL — ABNORMAL LOW (ref 12.0–15.0)
MCH: 27.1 pg (ref 26.0–34.0)
MCH: 27.6 pg (ref 26.0–34.0)
MCHC: 32.4 g/dL (ref 30.0–36.0)
MCHC: 33.3 g/dL (ref 30.0–36.0)
MCV: 83.5 fL (ref 78.0–100.0)
MCV: 82.7 fL (ref 78.0–100.0)
PLATELETS: 35 10*3/uL — AB (ref 150–400)
Platelets: 31 10*3/uL — ABNORMAL LOW (ref 150–400)
RBC: 3.1 MIL/uL — ABNORMAL LOW (ref 3.87–5.11)
RBC: 2.94 MIL/uL — AB (ref 3.87–5.11)
RDW: 25.4 % — AB (ref 11.5–15.5)
RDW: 25.9 % — ABNORMAL HIGH (ref 11.5–15.5)
WBC: 26.3 10*3/uL — AB (ref 4.0–10.5)
WBC: 29.1 10*3/uL — AB (ref 4.0–10.5)

## 2017-05-27 LAB — HEPATIC FUNCTION PANEL
ALBUMIN: 1.6 g/dL — AB (ref 3.5–5.0)
ALT: 25 U/L (ref 14–54)
AST: 53 U/L — AB (ref 15–41)
Alkaline Phosphatase: 145 U/L — ABNORMAL HIGH (ref 38–126)
Bilirubin, Direct: 0.8 mg/dL — ABNORMAL HIGH (ref 0.1–0.5)
Indirect Bilirubin: 0.8 mg/dL (ref 0.3–0.9)
TOTAL PROTEIN: 6.5 g/dL (ref 6.5–8.1)
Total Bilirubin: 1.6 mg/dL — ABNORMAL HIGH (ref 0.3–1.2)

## 2017-05-27 LAB — GLUCOSE, CAPILLARY
GLUCOSE-CAPILLARY: 148 mg/dL — AB (ref 65–99)
GLUCOSE-CAPILLARY: 173 mg/dL — AB (ref 65–99)
Glucose-Capillary: 166 mg/dL — ABNORMAL HIGH (ref 65–99)

## 2017-05-27 LAB — PHOSPHORUS: Phosphorus: 3 mg/dL (ref 2.5–4.6)

## 2017-05-27 LAB — MAGNESIUM: MAGNESIUM: 2.5 mg/dL — AB (ref 1.7–2.4)

## 2017-05-27 LAB — SAVE SMEAR

## 2017-05-27 MED ORDER — POTASSIUM CHLORIDE 20 MEQ/15ML (10%) PO SOLN
40.0000 meq | Freq: Once | ORAL | Status: AC
  Administered 2017-05-27: 40 meq via ORAL
  Filled 2017-05-27: qty 30

## 2017-05-27 MED ORDER — ACETAMINOPHEN 325 MG PO TABS
650.0000 mg | ORAL_TABLET | Freq: Three times a day (TID) | ORAL | Status: DC | PRN
  Administered 2017-05-27: 650 mg
  Filled 2017-05-27: qty 2

## 2017-05-27 MED ORDER — SODIUM CHLORIDE 0.9% FLUSH
10.0000 mL | INTRAVENOUS | Status: DC | PRN
  Administered 2017-05-28: 10 mL
  Filled 2017-05-27: qty 40

## 2017-05-27 MED ORDER — VITAL AF 1.2 CAL PO LIQD
1000.0000 mL | ORAL | Status: DC
Start: 2017-05-27 — End: 2017-05-31
  Administered 2017-05-27 – 2017-05-30 (×4): 1000 mL

## 2017-05-27 MED ORDER — VITAL HIGH PROTEIN PO LIQD
1000.0000 mL | ORAL | Status: DC
  Administered 2017-05-27: 1000 mL

## 2017-05-27 MED ORDER — SODIUM CHLORIDE 0.9% FLUSH
3.0000 mL | Freq: Two times a day (BID) | INTRAVENOUS | Status: DC
  Administered 2017-05-27 – 2017-05-31 (×6): 3 mL via INTRAVENOUS

## 2017-05-27 MED ORDER — PANTOPRAZOLE SODIUM 40 MG IV SOLR
40.0000 mg | Freq: Two times a day (BID) | INTRAVENOUS | Status: DC
  Administered 2017-05-27 – 2017-05-31 (×9): 40 mg via INTRAVENOUS
  Filled 2017-05-27 (×9): qty 40

## 2017-05-27 MED ORDER — SODIUM CHLORIDE 0.9% FLUSH: 3.0000 mL | INTRAVENOUS | Status: DC | PRN

## 2017-05-27 MED ORDER — SODIUM CHLORIDE 0.9 % IV SOLN: 250.0000 mL | INTRAVENOUS | Status: DC | PRN

## 2017-05-27 MED ORDER — CHLORHEXIDINE GLUCONATE CLOTH 2 % EX PADS
6.0000 | MEDICATED_PAD | Freq: Every day | CUTANEOUS | Status: DC
  Administered 2017-05-27 – 2017-05-31 (×5): 6 via TOPICAL

## 2017-05-27 NOTE — Progress Notes (Addendum)
Initial Nutrition Assessment  DOCUMENTATION CODES:   Not applicable  INTERVENTION:    D/C Vital High Protein  Initiate Vital AF 1.2 at goal rate of 50 ml/h (1200 ml per day)   TF regimen + Propofol provides 1648 kcals, 90 gm protein, 973 ml of free water daily  NUTRITION DIAGNOSIS:   Inadequate oral intake related to inability to eat as evidenced by NPO status  GOAL:   Patient will meet greater than or equal to 90% of their needs  MONITOR:   TF tolerance, Vent status, Labs, Weight trends, Skin, I & O's  REASON FOR ASSESSMENT:   Consult Enteral/tube feeding initiation and management  ASSESSMENT:   66 yo Female admitted for dehydration 6/17 and acute renal failure and unfortunately suffered CVA the following day. MRI demonstrates embolic disease. 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  Patient is currently intubated on ventilator support MV: 11 L/min Temp (24hrs), Avg:99.9 F (37.7 C), Min:98.1 F (36.7 C), Max:102.6 F (39.2 C)  Propofol: 7.9 ml/hr >> 208 lipid kcal  Pt not following commands. [place] on. Laying somewhat contorted in bed. Vital High Protein odered via Adult Tube Feeding Protocol. Currently infusing at 20 ml/hr. Not a candidate for surgery for on fibroelastoma on mitral valve.  Medications reviewed and include Protonix and ABX. Labs reviewed. Mg 2.5 (H). TG 157 (H). CBG's [id number].  Unable to complete Nutrition-Focused physical exam at this time.   Diet Order:  Diet NPO time specified  Skin:  Reviewed, no issues    Intake/Output Summary (Last 24 hours) at 05/27/17 1514 Last data filed at 05/27/17 1400  Gross per 24 hour  Intake          1756.47 ml  Output              940 ml  Net           816.47 ml   Last BM:  PTA  Height:   Ht Readings from Last 1 Encounters:  16-Aug-2016 5\' 3"  (1.6 m)    Weight:   Wt Readings from Last 1 Encounters:  05/27/17 118 lb 9.7 oz (53.8 kg)    Ideal Body  Weight:  52.2 kg  BMI:  Body mass index is 21.01 kg/m.  Estimated Nutritional Needs:   Kcal:  1696  Protein:  80-95 gm  Fluid:  per MD  EDUCATION NEEDS:   No education needs identified at this time  Maureen ChattersKatie Kary Colaizzi, RD, LDN Pager #: [phone] After-Hours Pager #: [phone]

## 2017-05-27 NOTE — Progress Notes (Signed)
Pt temp of 102.6 F orally. RN notified.

## 2017-05-27 NOTE — Progress Notes (Signed)
PHARMACY - PHYSICIAN COMMUNICATION CRITICAL VALUE ALERT - BLOOD CULTURE IDENTIFICATION (BCID)  Results for orders placed or performed during the hospital encounter of 16-Apr-2017  Blood Culture ID Panel (Reflexed) (Collected: Apr 11, 2017  6:37 PM)  Result Value Ref Range   Enterococcus species NOT DETECTED NOT DETECTED   Listeria monocytogenes NOT DETECTED NOT DETECTED   Staphylococcus species DETECTED (A) NOT DETECTED   Staphylococcus aureus DETECTED (A) NOT DETECTED   Methicillin resistance NOT DETECTED NOT DETECTED   Streptococcus species NOT DETECTED NOT DETECTED   Streptococcus agalactiae NOT DETECTED NOT DETECTED   Streptococcus pneumoniae NOT DETECTED NOT DETECTED   Streptococcus pyogenes NOT DETECTED NOT DETECTED   Acinetobacter baumannii NOT DETECTED NOT DETECTED   Enterobacteriaceae species NOT DETECTED NOT DETECTED   Enterobacter cloacae complex NOT DETECTED NOT DETECTED   Escherichia coli NOT DETECTED NOT DETECTED   Klebsiella oxytoca NOT DETECTED NOT DETECTED   Klebsiella pneumoniae NOT DETECTED NOT DETECTED   Proteus species NOT DETECTED NOT DETECTED   Serratia marcescens NOT DETECTED NOT DETECTED   Haemophilus influenzae NOT DETECTED NOT DETECTED   Neisseria meningitidis NOT DETECTED NOT DETECTED   Pseudomonas aeruginosa NOT DETECTED NOT DETECTED   Candida albicans NOT DETECTED NOT DETECTED   Candida glabrata NOT DETECTED NOT DETECTED   Candida krusei NOT DETECTED NOT DETECTED   Candida parapsilosis NOT DETECTED NOT DETECTED   Candida tropicalis NOT DETECTED NOT DETECTED    1/2 blood cx with MSSA, currently on vanc + zosyn Changes to prescribed antibiotics required: None   Baldemar FridayMasters, Karalynn Cottone M 05/27/2017  4:01 PM

## 2017-05-27 NOTE — Progress Notes (Signed)
Cooling blanket ordered. Other cooling interventions not effective. MD and NP notified new orders placed.

## 2017-05-27 NOTE — Progress Notes (Addendum)
PULMONARY / CRITICAL CARE MEDICINE   Name: Yvonne Fields MRN: [id number] DOB: 1950-11-11    ADMISSION DATE:  05/16/2017 CONSULTATION DATE:  05/23/2017  REFERRING MD:  Dr. Francia Greaves hospitalist  CHIEF COMPLAINT:  Embolic stroke  HISTORY OF PRESENT ILLNESS:   66 year old female with past medical history significant for COPD, GERD, hepatitis C, congestive heart failure, and recent urinary tract infection. Also pubic rami fracture diagnosed September of this year. She presented to [facility] on 10/17 with complaints of nausea, vomiting, diarrhea, and abdominal pain 10 days. She was admitted for dehydration and associated acute kidney injury. The following day she awoke from sleep with right-sided facial droop and associated weakness. Code stroke was activated, it was felt as though she had suffered an ischemic event. She was not given TPA due to unknown time of last no well and INR greater than 2. MRI of the brain revealed multiple areas of infarction consistent with embolic disease. 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 After the TEE was hypoxemic requiring intubation.Chest x-ray at that time demonstrated left lung collapse. She underwent a bronchoscopy during which copious secretions were removed including some plugs. She was transferred to [facility] for surgical consultation.   SUBJECTIVE:  Sedated, intubated, frail female supine in bed.   VITAL SIGNS: BP (!) 152/50   Pulse (!) 136   Temp (!) 102.6 F (39.2 C) (Oral)   Resp (!) 23   Ht 5\' 3"  (1.6 m)   Wt 118 lb 9.7 oz (53.8 kg)   SpO2 92%   BMI 21.01 kg/m   HEMODYNAMICS:    VENTILATOR SETTINGS: Vent Mode: PRVC FiO2 (%):  [40 %-50 %] 50 % Set Rate:  [18 bmp] 18 bmp Vt Set:  [500 mL] 500 mL PEEP:  [5 cmH20-8 cmH20] 5 cmH20 Plateau Pressure:  [11 cmH20-19 cmH20] 11 cmH20  INTAKE / OUTPUT: I/O last 3 completed shifts: In: 1219.6 [I.V.:739.6; Blood:380;  IV Piggyback:100] Out: 700 [Urine:700]  PHYSICAL EXAMINATION: General:  Frail adult-elderly female in NAD on vent Neuro:  Opens eyes to voice. No follow command, track or focus, Sedated. RASS -1 HEENT:  [place]/AT, PERRL, no JVD. MM dry, OG tube Cardiovascular: S1, S2,  RRR, no MRG. No edema.  Lungs:  Coarse, bilateral breath sounds, diminished per bases Abdomen:  Soft, non-tender, non-distended Musculoskeletal:  No acute deformity  Skin: warn, dry and intact, no lesions or rash  LABS:  BMET  Recent Labs Lab 05/25/2017 1629 05/27/17 0425  NA 137 138  K 3.5 3.5  CL 118* 111  CO2 13* 17*  BUN 54* 50*  CREATININE 1.40* 1.31*  GLUCOSE 95 139*    Electrolytes  Recent Labs Lab 06/06/2017 1629 05/27/17 0425  CALCIUM 7.5* 8.0*  MG 1.6* 2.5*  PHOS 3.4 3.0    CBC  Recent Labs Lab 05/21/2017 1629 05/27/17 0425  WBC 22.7* 26.3*  HGB 6.4* 8.4*  HCT 20.0* 25.9*  PLT 33* 35*    Coag's  Recent Labs Lab 05/17/2017 1629  INR 1.85    Sepsis Markers  Recent Labs Lab 05/14/2017 1629 06/05/2017 1716 05/31/2017 1947  LATICACIDVEN  --  0.9 0.8  PROCALCITON 11.49  --   --     ABG  Recent Labs Lab 05/31/2017 1745 05/27/17 0822  PHART 7.278* 7.402  PCO2ART 31.3* 28.8*  PO2ART 88.7 64.8*    Liver Enzymes  Recent Labs Lab 05/20/2017 1629 05/27/17 0425  AST 48* 53*  ALT  25 25  ALKPHOS 129* 145*  BILITOT 1.1 1.6*  ALBUMIN 1.5* 1.6*    Cardiac Enzymes No results for input(s): TROPONINI, PROBNP in the last 168 hours.  Glucose  Recent Labs Lab 05/27/17 0744  GLUCAP 148*    Imaging Dg Chest Port 1 View  Result Date: 05/27/2017 CLINICAL DATA:  Embolic stroke and respiratory failure. EXAM: PORTABLE CHEST 1 VIEW COMPARISON:  06/04/2017 and prior radiographs FINDINGS: Endotracheal tube images not significantly changed with tip 4.5 cm above the carina. An NG tube enters the stomach with tip off the field of view. A right PICC line is present with tip overlying the  superior cavoatrial junction. Decreased lung volumes with slightly increased bilateral airspace opacities. No evidence of pneumothorax. IMPRESSION: 1. Support apparatus as described. Endotracheal tube with tip 4.5 cm above the carina. 2. Decreased lung volumes with slightly increased bilateral airspace opacities. Electronically Signed   By: Harmon PierJeffrey  Hu M.D.   On: 05/27/2017 06:58   Dg Chest Port 1 View  Result Date: 05/24/2017 CLINICAL DATA:  Respiratory failure. EXAM: PORTABLE CHEST 1 VIEW COMPARISON:  06/06/2017. FINDINGS: Endotracheal tube and right PICC line stable position. Interim placement NG tube, its tip is below left hemidiaphragm. Heart size stable. Interim improved aeration left lung with resolution of left upper and lower lobe atelectasis. Multifocal bilateral pulmonary infiltrates/edema again noted. No pneumothorax. IMPRESSION: 1. Interim placement of NG tube, its tip is below left hemidiaphragm. Endotracheal tube and right PICC line stable position. 2. Interim resolution of atelectatic changes throughout the left lung. 3.  Persistent multifocal bilateral pulmonary infiltrates/edema . Electronically Signed   By: Maisie Fushomas  Register   On: 05/14/2017 17:03     STUDIES:  MRI Brain 10/18 > multiple areas bilateral infarct consistent with embolic disease Carotid ultrasound > unremarkable Echo 10/18 > heterogeneous density on mitral valve suggestive of vegetation, myxoma, fibroelastoma, or organized thrombus.  TEE 10/19 > LVEF 50-55%, mild to mod mitral regurg unable to rule out veg.  DG Chest 05/27/2017>>Decreased lung volumes with slightly increased bilateral airspace Opacities.  CULTURES: Blood 10/18 >GS: Aerobic gram + cocci in clusters ( [place] [facility]) Blood  10/19> Sputum 10/19 > GS: ABUNDANT WBC PRESENT,BOTH PMN AND MONONUCLEAR  RARE GRAM POSITIVE COCCI IN PAIRS  RARE BUDDING YEAST SEEN >> 10/18: + POC MSSA ( [place])  Cultures at [facility] prior to transfer:  Urine c/s 10/17  negative Bronch c/s 19th - pending  ANTIBIOTICS: Zosyn 10/19> Vancomycin 10/19 >  SIGNIFICANT EVENTS: 10/17 admit for dehydration AKI 10/18 embolic CVA 10/19 ETT, intubated for hypoxia, and [facility].   LINES/TUBES: ETT 10/19 > PICC 10/19 >  DISCUSSION: 66 year old female admitted for dehydration 6/17 and acute renal failure and unfortunately suffered CVA the following day. MRI demonstrates embolic disease. 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  ASSESSMENT / PLAN:  PULMONARY A: Acute hypoxemic respiratory failure - likely multifactorial with possible aspiration, mucous plugs. 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 Hypotension likely sedation related>> resolved 10/20 History of CHF, unspecified.  Tachycardia >>?  2/2 fever [facility] P:  Telemetry monitoring in ICU setting IVF resuscitation Maintain MAP > 65 Will need  CVTS, cardiology consultation.   RENAL A:   Acute kidney injury [place]ag Hypokalemia Creatinine down trending 10/20 Non-gap metabolic acidosis improving  P:   Bicarb GTT at 50 cc hr, should be able to d/c within next 24 hours Replete electrolytes as needed Follow BMP. CMET daily Monitor urine output Avoid nephrotoxic medications  GASTROINTESTINAL A:   Nausea/vomiting/diarrhea x 10 days. ? If embolic disease to gut as well.  Cirrhosis (radiographic evidence) with known hepatitis C  P:   Tube Feeds as tolerated PPI: Protonix Guaiac stools  HEMATOLOGIC A:   Anemia Thrombocytopenia - Acute on chronic Drop in HGB overnight 10/20>> HGB drop to 6.4 with  transfusion of 1 unit PRBC P:  Trend CBC/ platelets Trend Coags Transfuse for HGB < 7 Monitor for obvious source of bleeding Hemocult stools  INFECTIOUS A:   Presumed aspiration PNA - 48 hours into hospitalization so will treat as HCAP Mitral valve mass - fibroelastoma favored, but cannot rule out infective endocarditis.  Hepatitis C T Max: 102.6  P:   ABX as above Follow micro for Blood, sputum cultures Follow WBC and fever curve. Trend temp and WBC Tylenol for temp > 101.5 Consider re-culturing for temp > 101.5 consistently  ENDOCRINE A:   No acute issues A1C 5.2 prior to admission P:   Follow glucose on chemistry CBG Q4 Consider SSI if > 150 consistently  NEUROLOGIC A:   Acute CVA - multiple bilateral infarcts suggesting embolic disease.  Acute metabolic encephalopathy in setting of , CVA. Not following commands, but opens eyes to call of name with decrease in sedation P:   RASS goal: -1 to -2 Propofol infusion PRN fentanyl Per neuro recs: If change in neuro exam, consider MRI or head neck vessel imaging   FAMILY  - Updates: No Family at bedside 10/20  - Inter-disciplinary family meet or Palliative Care meeting due by:  10/26     Bevelyn Ngo, AGACNP-BC [place] [facility]/Critical Care Pager [phone] or [phone]  05/27/2017 10:46 AM   Independently examined pt, evaluated data & formulated above care plan with NP  66 year old woman with COPD and hep C cirrhosis admitted to [facility] 10/17 with nausea or vomiting and abdominal pain for 10 days, developed right-sided facial droop and weakness on 10/18 with MRI showing multiple embolic infarcts. 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 On exam-frail, intubated, unresponsive  on sedation, bilateral scattered crackles, no rhonchi, S1-S2 tachycardia, no edema, soft nontender abdomen, pallor present  Chest x-ray reviewed by me shows bilateral infiltrates and ET tube in position  Labs show resolved metabolic acidosis, mild hypokalemia low albumin 1.6, hemoglobin dropped to 6.4 which corrected to 8.4 with 1 unit PRBC , severe leukocytosis and drop in platelets from 113 on admission to 35  Impression/plan  Acute respiratory failure-with bilateral infiltrates, likely related to aspiration pneumonia No wean until hemodynamically improved  Sepsis - Repeat cultures will await BAL cultures from 10 /19 at [facility] Continue broad-spectrum antibiotics in the meantime  ACute encephalopathy-in the setting of multiple embolic infarcts Not a candidate for anticoagulation due to severe thrombocytopenia Use propofol as tolerated to enable frequent neuro checks Discussed with cardiology TCTS-definitely not a candidate for surgery for 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 Acute illness, rule out acute blood loss-check stool guaiac, tr to maintain hemoglobin 8 and about Severe thrombocytopenia- send HITT panel, no heparin or products  The patient is critically ill with multiple organ systems failure and requires high complexity decision making for assessment and support, frequent evaluation and titration of therapies, application of advanced monitoring technologies and extensive interpretation of multiple databases. Critical Care Time devoted to patient care services described in this note independent of APP time is 35 minutes.   Oretha Milch MD

## 2017-05-27 NOTE — Progress Notes (Signed)
PT temp 102.3 F orally. RN notified.

## 2017-05-27 NOTE — Progress Notes (Signed)
Pt temp of 102.6 F orally. RN notified of temp.

## 2017-05-27 NOTE — Progress Notes (Signed)
STROKE TEAM PROGRESS NOTE   HISTORY OF PRESENT ILLNESS (per record) Yvonne Fields is a 67 y.o. female who has a past medical history of hepatitis C, congestive heart failure, COPD, who presented to [facility] health on 10/17 and was admitted for nausea vomiting diarrhea and acute renal failure along with leukocytosis and lactic acidosis. On the following morning, on 05/25/2017, she was observed to have right-sided facial weakness and a code stroke was activated at that hospital.  She was evaluated by telemetry neurology, who evaluated the patient in the internal medicine but did not deem her a candidate for IV TPA because of thrombocytopenia.  Further imaging was recommended.  An MRI of the brain was obtained that showed embolic-looking infarcts in both cerebral hemispheres.  Head and neck vessel imaging was unremarkable for source.  Transthoracic echo was done that revealed a echogenicity below the mitral valve.  A TEE was done.  Differentials for the mass include tumor versus infection. Patient was transferred to [facility] for higher level of care.  After the D was done, patient had respiratory compromise and needed to be intubated.  Currently at this time, patient is intubated and on propofol.  She was on fentanyl up until less than an hour ago. All the paperwork from [facility] is in the paper chart. Images are available on PACS but no reports are available in the electronic medical record.  LKW: 05/24/2017 tpa given?: no, code stroke was an outside hospital, transferred to [facility] is for predominantly anomaly found on echocardiogram in the mitral valve.  Way outside the window for intervention Premorbid modified Rankin scale (mRS):  Unable to ascertain from the records. No family at bedside yet.   SUBJECTIVE (INTERVAL HISTORY) No family is at the bedside.  Pt intubated on sedation. Brief open eyes on stimulation but not able to remain eyes open. Not following commands. Fever  102.6 and tachydardia at 130s. Overnight anemia with Hb 6.4 and received 1xPRBC and this am Hb 8.4. However, significant leukocytosis and thrombocytopenia. OSH TEE suggesting MV mass vs. Vegetation. MRI cardioembolic infarcts.    OBJECTIVE Temp:  [98.1 F (36.7 C)-102.6 F (39.2 C)] 102.6 F (39.2 C) (10/20 0743) Pulse Rate:  [108-140] 136 (10/20 0845) Cardiac Rhythm: Sinus tachycardia (10/20 0400) Resp:  [16-28] 23 (10/20 0845) BP: (109-156)/(46-56) 152/50 (10/20 0845) SpO2:  [87 %-100 %] 92 % (10/20 0845) FiO2 (%):  [40 %-50 %] 50 % (10/20 0714) Weight:  [115 lb 8.3 oz (52.4 kg)-118 lb 9.7 oz (53.8 kg)] 118 lb 9.7 oz (53.8 kg) (10/20 0500)  CBC:   Recent Labs Lab 06/11/2017 1629 05/27/17 0425  WBC 22.7* 26.3*  NEUTROABS 19.7*  --   HGB 6.4* 8.4*  HCT 20.0* 25.9*  MCV 83.7 83.5  PLT 33* 35*    Basic Metabolic Panel:   Recent Labs Lab June 11, 2017 1629 05/27/17 0425  NA 137 138  K 3.5 3.5  CL 118* 111  CO2 13* 17*  GLUCOSE 95 139*  BUN 54* 50*  CREATININE 1.40* 1.31*  CALCIUM 7.5* 8.0*  MG 1.6* 2.5*  PHOS 3.4 3.0    Lipid Panel:     Component Value Date/Time   CHOL 51 June 11, 2017 1832   TRIG 124 06-11-2017 1947   HDL <10 (L) 06/11/17 1832   CHOLHDL NOT CALCULATED 06-11-17 1832   VLDL 31 06-11-2017 1832   LDLCALC NOT CALCULATED 2017-06-11 1832   HgbA1c:  Lab Results  Component Value Date   HGBA1C 5.3 05/27/2017  Urine Drug Screen: No results found for: LABOPIA, COCAINSCRNUR, LABBENZ, AMPHETMU, THCU, LABBARB  Alcohol Level No results found for: [facility]  IMAGING I have personally reviewed the radiological images below and agree with the radiology interpretations.  Dg Chest Port 1 View 05/27/2017 IMPRESSION:  1. Interim placement of NG tube, its tip is below left hemidiaphragm. Endotracheal tube and right PICC line stable position.  2. Interim resolution of atelectatic changes throughout the left lung.  3. Persistent multifocal bilateral pulmonary  infiltrates/edema .   MRI - embolic-looking infarcts in both cerebral hemispheres.    MRA Head and neck vessel imaging was unremarkable for source.   TTE revealed a echogenicity below the mitral valve.    TEE - mitral valve 1 x 1.5cm heterogeneous density attached to the posterior mitral valve, fixed but with some mobility, ddx including vegetation, fibroelastoma or organized thrombus.    PHYSICAL EXAM  Vitals:   05/27/17 0800 05/27/17 0815 05/27/17 0830 05/27/17 0845  BP: (!) 152/50 (!) 152/50 (!) 152/50 (!) 152/50  Pulse: (!) 140 (!) 135 (!) 136 (!) 136  Resp: (!) 22 (!) 22 (!) 24 (!) 23  Temp:      TempSrc:      SpO2: (!) 87% 92% 91% 92%  Weight:      Height:        Temp:  [98.1 F (36.7 C)-102.6 F (39.2 C)] 102.6 F (39.2 C) (10/20 1142) Pulse Rate:  [108-140] 131 (10/20 1145) Resp:  [16-28] 21 (10/20 1145) BP: (109-159)/(46-56) 137/56 (10/20 1145) SpO2:  [87 %-100 %] 90 % (10/20 1145) FiO2 (%):  [40 %-50 %] 50 % (10/20 0714) Weight:  [115 lb 8.3 oz (52.4 kg)-118 lb 9.7 oz (53.8 kg)] 118 lb 9.7 oz (53.8 kg) (10/20 0500)  General - intubated and sedated.   Ophthalmologic - Fundi not visualized due to noncooperation.  Cardiovascular - tachycardia.  Neuro - intubated and sedated on propofol. Brief eye opening on voice but not able to remain opening. Not following commands. PERRL, not tracking, doll's eye present, positive corneal and gag. On pain stimulation, LLE mild withdraw but no significant movement on other limbs. DTR diminished and no Babinski. Sensation, coordination and gait not tested.    ASSESSMENT/PLAN [name] is a 66 y.o. female with history of hepatitis C, congestive heart failure, COPD, presenting with Right facial weakness associated with nausea, vomiting, diarrhea, recent UTI, acute renal failure, leukocytosis, and lactic acidosis. She did not receive IV t-PA due to thrombocytopenia.  Strokes:  Bilateral embolid infarcts, likely due  to MV vegetation, concerning for endocarditis.   Resultant  Intubated on sedation, lack of response  MRI head - OSH - right PCA, right MCA/ACA, left IC, left MCA/PCA punctate infarcts.    MRA head and neck - OSH - unremarkable  TTE - OSH - EF 50-55%, echogenicity below the mitral valve  TEE - OSH - left 1x1.5cm MV mass vs. Vegetation, endocarditis vs. Fibroelastoma vs. Organized thrombus.  Carotid Doppler - OSH -unremarkable  LDL - not calculated due to low HDL  HgbA1c - 5.3  VTE prophylaxis - SCDs Diet NPO time specified  aspirin 81 mg daily prior to admission, now on No antithrombotic. Highly concerning for endocarditis given fever, leukocytosis, MV vegetation. No antiplatelet indicated for endocarditis. In addition, pt has sever thrombocytopenia and anemia.   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  TTE and TEE confirmed MV vegetation  Significant leukocystosis  High grade fever and AMS  Blood culture pending  On vanco and zosyn  Recommend cardiology and ID consultation   Leukocytosis  WBC 22.7->26.3  Concerning for endocarditis  On vanco and zosyn  Anemia   Hb 6.4->8.4 s/p PRBC  Close observation  Thrombocytopenia with elevated INR  Platelet 33->35  Avoid antiplatelet  Ordered peripheral blood smear - pending  Recommend hematology consultation to rule out TTP or DIC  Hypertension  Stable  Permissive hypertension (OK if < 180/105) but gradually normalize in 5-7 days  Long-term BP goal normotensive  Other Stroke Risk Factors  Advanced age  Other Active Problems  Acute kidney injury - Cre 1.31  Presumed aspiration pneumonia - on Abx  Sinus tachycardia  Hospital day # 1  This patient is critically ill due to stroke, MV vegetation,  high fever, leukocytosis, thrombocytopenia, anemia and at significant risk of neurological worsening, death form recurrent stroke, anemia, bleeding, heart failure, sepsis. This patient's care requires constant monitoring of vital signs, hemodynamics, respiratory and cardiac monitoring, review of multiple databases, neurological assessment, discussion with family, other specialists and medical decision making of high complexity. I spent 45 minutes of neurocritical care time in the care of this patient.  Marvel PlanJindong Elizah Lydon, MD PhD Stroke Neurology 05/27/2017 12:19 PM   To contact Stroke Continuity provider, please refer to [email or web address]. After hours, contact General Neurology

## 2017-05-28 ENCOUNTER — Inpatient Hospital Stay (HOSPITAL_COMMUNITY): Payer: Medicaid Other

## 2017-05-28 DIAGNOSIS — I38 Endocarditis, valve unspecified: Secondary | ICD-10-CM

## 2017-05-28 DIAGNOSIS — A4101 Sepsis due to Methicillin susceptible Staphylococcus aureus: Secondary | ICD-10-CM

## 2017-05-28 DIAGNOSIS — I6340 Cerebral infarction due to embolism of unspecified cerebral artery: Principal | ICD-10-CM

## 2017-05-28 DIAGNOSIS — I330 Acute and subacute infective endocarditis: Secondary | ICD-10-CM

## 2017-05-28 DIAGNOSIS — J9690 Respiratory failure, unspecified, unspecified whether with hypoxia or hypercapnia: Secondary | ICD-10-CM

## 2017-05-28 DIAGNOSIS — R6521 Severe sepsis with septic shock: Secondary | ICD-10-CM

## 2017-05-28 DIAGNOSIS — R7881 Bacteremia: Secondary | ICD-10-CM

## 2017-05-28 DIAGNOSIS — D696 Thrombocytopenia, unspecified: Secondary | ICD-10-CM

## 2017-05-28 DIAGNOSIS — A419 Sepsis, unspecified organism: Secondary | ICD-10-CM

## 2017-05-28 DIAGNOSIS — D72829 Elevated white blood cell count, unspecified: Secondary | ICD-10-CM

## 2017-05-28 DIAGNOSIS — J449 Chronic obstructive pulmonary disease, unspecified: Secondary | ICD-10-CM

## 2017-05-28 DIAGNOSIS — J189 Pneumonia, unspecified organism: Secondary | ICD-10-CM

## 2017-05-28 DIAGNOSIS — D649 Anemia, unspecified: Secondary | ICD-10-CM

## 2017-05-28 DIAGNOSIS — B9561 Methicillin susceptible Staphylococcus aureus infection as the cause of diseases classified elsewhere: Secondary | ICD-10-CM

## 2017-05-28 DIAGNOSIS — E46 Unspecified protein-calorie malnutrition: Secondary | ICD-10-CM

## 2017-05-28 DIAGNOSIS — B182 Chronic viral hepatitis C: Secondary | ICD-10-CM

## 2017-05-28 DIAGNOSIS — K7460 Unspecified cirrhosis of liver: Secondary | ICD-10-CM

## 2017-05-28 DIAGNOSIS — R931 Abnormal findings on diagnostic imaging of heart and coronary circulation: Secondary | ICD-10-CM

## 2017-05-28 DIAGNOSIS — R6520 Severe sepsis without septic shock: Secondary | ICD-10-CM

## 2017-05-28 DIAGNOSIS — E876 Hypokalemia: Secondary | ICD-10-CM

## 2017-05-28 DIAGNOSIS — R233 Spontaneous ecchymoses: Secondary | ICD-10-CM

## 2017-05-28 LAB — CBC WITH DIFFERENTIAL/PLATELET
BASOS ABS: 0 10*3/uL (ref 0.0–0.1)
Basophils Relative: 0 %
Eosinophils Absolute: 0 10*3/uL (ref 0.0–0.7)
Eosinophils Relative: 0 %
HCT: 23.1 % — ABNORMAL LOW (ref 36.0–46.0)
HEMOGLOBIN: 7.6 g/dL — AB (ref 12.0–15.0)
LYMPHS PCT: 10 %
Lymphs Abs: 2.6 10*3/uL (ref 0.7–4.0)
MCH: 27.4 pg (ref 26.0–34.0)
MCHC: 32.9 g/dL (ref 30.0–36.0)
MCV: 83.4 fL (ref 78.0–100.0)
MONOS PCT: 6 %
Monocytes Absolute: 1.6 10*3/uL — ABNORMAL HIGH (ref 0.1–1.0)
Neutro Abs: 22 10*3/uL — ABNORMAL HIGH (ref 1.7–7.7)
Neutrophils Relative %: 84 %
Platelets: 18 10*3/uL — CL (ref 150–400)
RBC: 2.77 MIL/uL — AB (ref 3.87–5.11)
RDW: 26.3 % — ABNORMAL HIGH (ref 11.5–15.5)
WBC: 26.2 10*3/uL — AB (ref 4.0–10.5)

## 2017-05-28 LAB — BASIC METABOLIC PANEL
ANION GAP: 4 — AB (ref 5–15)
BUN: 37 mg/dL — AB (ref 6–20)
CHLORIDE: 112 mmol/L — AB (ref 101–111)
CO2: 23 mmol/L (ref 22–32)
Calcium: 7.4 mg/dL — ABNORMAL LOW (ref 8.9–10.3)
Creatinine, Ser: 1.1 mg/dL — ABNORMAL HIGH (ref 0.44–1.00)
GFR calc Af Amer: >60 mL/min (ref >60)
GFR calc non Af Amer: 52 mL/min — ABNORMAL LOW (ref >60)
GLUCOSE: 191 mg/dL — AB (ref 65–99)
POTASSIUM: 4.9 mmol/L (ref 3.5–5.1)
SODIUM: 139 mmol/L (ref 135–145)

## 2017-05-28 LAB — GLUCOSE, CAPILLARY
GLUCOSE-CAPILLARY: 120 mg/dL — AB (ref 65–99)
GLUCOSE-CAPILLARY: 149 mg/dL — AB (ref 65–99)
GLUCOSE-CAPILLARY: 168 mg/dL — AB (ref 65–99)
GLUCOSE-CAPILLARY: 176 mg/dL — AB (ref 65–99)
GLUCOSE-CAPILLARY: 179 mg/dL — AB (ref 65–99)
Glucose-Capillary: 95 mg/dL (ref 65–99)
Glucose-Capillary: 178 mg/dL — ABNORMAL HIGH (ref 65–99)
Glucose-Capillary: 138 mg/dL — ABNORMAL HIGH (ref 65–99)

## 2017-05-28 LAB — COMPREHENSIVE METABOLIC PANEL
ALBUMIN: 1.5 g/dL — AB (ref 3.5–5.0)
ALT: 30 U/L (ref 14–54)
AST: 99 U/L — AB (ref 15–41)
Alkaline Phosphatase: 134 U/L — ABNORMAL HIGH (ref 38–126)
Anion gap: 7 (ref 5–15)
BUN: 37 mg/dL — AB (ref 6–20)
CHLORIDE: 107 mmol/L (ref 101–111)
CO2: 26 mmol/L (ref 22–32)
Calcium: 7.1 mg/dL — ABNORMAL LOW (ref 8.9–10.3)
Creatinine, Ser: 1.06 mg/dL — ABNORMAL HIGH (ref 0.44–1.00)
GFR calc Af Amer: >60 mL/min (ref >60)
GFR calc non Af Amer: 54 mL/min — ABNORMAL LOW (ref >60)
GLUCOSE: 286 mg/dL — AB (ref 65–99)
POTASSIUM: 2.8 mmol/L — AB (ref 3.5–5.1)
SODIUM: 140 mmol/L (ref 135–145)
Total Bilirubin: 1.6 mg/dL — ABNORMAL HIGH (ref 0.3–1.2)
Total Protein: 5.7 g/dL — ABNORMAL LOW (ref 6.5–8.1)

## 2017-05-28 LAB — CBC
HCT: 24.6 % — ABNORMAL LOW (ref 36.0–46.0)
HEMOGLOBIN: 8.1 g/dL — AB (ref 12.0–15.0)
MCH: 27.5 pg (ref 26.0–34.0)
MCHC: 32.9 g/dL (ref 30.0–36.0)
MCV: 83.4 fL (ref 78.0–100.0)
Platelets: 22 10*3/uL — CL (ref 150–400)
RBC: 2.95 MIL/uL — AB (ref 3.87–5.11)
RDW: 26.2 % — ABNORMAL HIGH (ref 11.5–15.5)
WBC: 31.2 10*3/uL — ABNORMAL HIGH (ref 4.0–10.5)

## 2017-05-28 LAB — PROTIME-INR
INR: 1.62
Prothrombin Time: 19.1 seconds — ABNORMAL HIGH (ref 11.4–15.2)

## 2017-05-28 LAB — MAGNESIUM: MAGNESIUM: 1.9 mg/dL (ref 1.7–2.4)

## 2017-05-28 LAB — PHOSPHORUS: Phosphorus: 2.1 mg/dL — ABNORMAL LOW (ref 2.5–4.6)

## 2017-05-28 LAB — HIV ANTIBODY (ROUTINE TESTING W REFLEX): HIV Screen 4th Generation wRfx: NONREACTIVE

## 2017-05-28 MED ORDER — CEFAZOLIN SODIUM-DEXTROSE 2-4 GM/100ML-% IV SOLN
2.0000 g | Freq: Three times a day (TID) | INTRAVENOUS | Status: DC
  Administered 2017-05-28 – 2017-05-29 (×4): 2 g via INTRAVENOUS
  Filled 2017-05-28 (×4): qty 100

## 2017-05-28 MED ORDER — POTASSIUM CHLORIDE 20 MEQ/15ML (10%) PO SOLN
40.0000 meq | ORAL | Status: AC
  Administered 2017-05-28 (×2): 40 meq via ORAL
  Filled 2017-05-28 (×2): qty 30

## 2017-05-28 MED ORDER — POTASSIUM CHLORIDE 10 MEQ/50ML IV SOLN
10.0000 meq | INTRAVENOUS | Status: DC
  Administered 2017-05-28 (×2): 10 meq via INTRAVENOUS
  Filled 2017-05-28 (×5): qty 50

## 2017-05-28 NOTE — Progress Notes (Addendum)
2 more runs of Potassium 10 meq per  IV route then D/C remaining order per MD Vassie LollAlva at bedside.

## 2017-05-28 NOTE — Progress Notes (Signed)
STROKE TEAM PROGRESS NOTE   SUBJECTIVE (INTERVAL HISTORY) Daughter and sons are at the bedside.  Pt still intubated on sedation. Brief open eyes on voice even with sedation briefly on hold but not able to remain eyes open. Not following commands. Fever controlled, and Hb stable. However, still significant leukocytosis and worsening thrombocytopenia. Blood culture positive 2/2 for staph. Aures. On Abx. Discussed with Dr. Vassie Loll and family at bedside.    OBJECTIVE Temp:  [97.6 F (36.4 C)-102.6 F (39.2 C)] 98.9 F (37.2 C) (10/21 0759) Pulse Rate:  [98-137] 117 (10/21 0800) Cardiac Rhythm: Sinus tachycardia (10/21 0400) Resp:  [15-47] 17 (10/21 0800) BP: (94-161)/(39-126) 125/59 (10/21 0800) SpO2:  [86 %-95 %] 89 % (10/21 0800) FiO2 (%):  [50 %] 50 % (10/21 0721) Weight:  [118 lb 9.7 oz (53.8 kg)] 118 lb 9.7 oz (53.8 kg) (10/21 0455)  CBC:   Recent Labs Lab 06/05/2017 1629  05/27/17 1700 05/28/17 0450  WBC 22.7*  < > 29.1* 26.2*  NEUTROABS 19.7*  --   --  22.0*  HGB 6.4*  < > 8.1* 7.6*  HCT 20.0*  < > 24.3* 23.1*  MCV 83.7  < > 82.7 83.4  PLT 33*  < > 31* 18*  < > = values in this interval not displayed.  Basic Metabolic Panel:   Recent Labs Lab 05/27/17 0425 05/28/17 0450  NA 138 140  K 3.5 2.8*  CL 111 107  CO2 17* 26  GLUCOSE 139* 286*  BUN 50* 37*  CREATININE 1.31* 1.06*  CALCIUM 8.0* 7.1*  MG 2.5* 1.9  PHOS 3.0 2.1*    Lipid Panel:     Component Value Date/Time   CHOL 51 05/21/2017 1832   TRIG 124 05/17/2017 1947   HDL <10 (L) 06/06/2017 1832   CHOLHDL NOT CALCULATED 05/16/2017 1832   VLDL 31 05/20/2017 1832   LDLCALC NOT CALCULATED 05/31/2017 1832   HgbA1c:  Lab Results  Component Value Date   HGBA1C 5.3 05/27/2017   Urine Drug Screen: No results found for: LABOPIA, COCAINSCRNUR, LABBENZ, AMPHETMU, THCU, LABBARB  Alcohol Level No results found for: ETH  IMAGING I have personally reviewed the radiological images below and agree with the radiology  interpretations.  Dg Chest Port 1 View 05/11/2017 IMPRESSION:  1. Interim placement of NG tube, its tip is below left hemidiaphragm. Endotracheal tube and right PICC line stable position.  2. Interim resolution of atelectatic changes throughout the left lung.  3. Persistent multifocal bilateral pulmonary infiltrates/edema .   MRI -small bilateral acute infarcts suggesting embolic disease.  Infarcts are seen along the right occipital horn, left parietal white matter, left posterior limb internal capsule, and right corona radiata.  Questionable tiny right posterior frontal cortex infarct.    MRA Head -negative intracranial MRA.   Carotid Doppler - bilateral ICA less than 50% stenosis.  TTE revealed a echogenicity below the mitral valve.    TEE - mitral valve 1 x 1.5cm heterogeneous density attached to the posterior mitral valve, fixed but with some mobility, ddx including vegetation, fibroelastoma or organized thrombus.     PHYSICAL EXAM  Vitals:   05/28/17 0721 05/28/17 0730 05/28/17 0759 05/28/17 0800  BP: (!) 137/57 (!) 134/57  (!) 125/59  Pulse: (!) 111 (!) 113  (!) 117  Resp: (!) 21 20  17   Temp:   98.9 F (37.2 C)   TempSrc:   Core (Comment)   SpO2: (!) 89% (!) 86%  (!) 89%  Weight:  Height:        Temp:  [97.6 F (36.4 C)-102.6 F (39.2 C)] 98.9 F (37.2 C) (10/21 0759) Pulse Rate:  [98-137] 117 (10/21 0800) Resp:  [15-47] 17 (10/21 0800) BP: (94-161)/(39-126) 125/59 (10/21 0800) SpO2:  [86 %-95 %] 89 % (10/21 0800) FiO2 (%):  [50 %] 50 % (10/21 0721) Weight:  [118 lb 9.7 oz (53.8 kg)] 118 lb 9.7 oz (53.8 kg) (10/21 0455)  General - intubated and sedated.   Ophthalmologic - Fundi not visualized due to noncooperation.  Cardiovascular - tachycardia.  Neuro - intubated and sedated on propofol. Brief eye opening on voice even with sedation being held briefly but not able to remain opening. Not following commands. PERRL, not tracking, doll's eye present,  positive corneal and gag. On pain stimulation, LLE mild withdraw but no significant movement on other limbs. DTR diminished and no Babinski. Sensation, coordination and gait not tested.    ASSESSMENT/PLAN Ms. Amya Hlad is a 66 y.o. female with history of hepatitis C, congestive heart failure, COPD, presenting with Right facial weakness associated with nausea, vomiting, diarrhea, recent UTI, acute renal failure, leukocytosis, and lactic acidosis. She did not receive IV t-PA due to thrombocytopenia.  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   Resultant  Intubated on sedation, lack of response  MRI head - OSH - right PCA, right MCA/ACA, left IC, left MCA/PCA punctate infarcts.    MRA head - unremarkable, no mycotic aneurysm seen  CUS - unremarkable   TTE - OSH - EF 50-55%, echogenicity below the mitral valve  TEE - OSH - left 1x1.5cm MV mass vs. Vegetation, endocarditis vs. Fibroelastoma vs. Organized thrombus.  LDL - not calculated due to low HDL  HgbA1c - 5.3  Blood culture positive for MSSA  VTE prophylaxis - SCDs Diet NPO time specified  aspirin 81 mg daily prior to admission, now on No antithrombotic. No antiplatelet indicated for endocarditis. In addition, pt has severe thrombocytopenia and anemia.   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   Vanco and zosyn -> changed to Ancef 05/28/2017  Recommend cardiology and ID consultation   Leukocytosis  WBC 22.7->26.3 ->29.1 ->26.2  Vanco and zosyn -> changed to Ancef 05/28/2017  Tmax  102.6 yesterday -> cooling blanket   Anemia   Hb 6.4 - s/p PRBC ->8.4 -> 7.6  Close observation  Thrombocytopenia with elevated INR  Platelets 33->35 ->18  Avoid antiplatelet  INR 1.85->1.62  Ordered peripheral blood smear - target cells, no schistocytes seen   HIT antibody pending  Hypertension  Stable  Permissive hypertension (OK if < 180/105) but gradually normalize in 5-7 days  Long-term BP goal normotensive  Other Stroke Risk Factors  Advanced age  Other Active Problems  Acute kidney injury - Cre 1.31-> 1.06  Sinus tachycardia  Hypokalemia - 2.8 -> supplementation has been ordered  Hospital day # 2  This patient is critically ill due to stroke, MV vegetation, high fever, leukocytosis, thrombocytopenia, anemia and at significant risk of neurological worsening, death form recurrent stroke, anemia, bleeding, heart failure, sepsis. This patient's care requires constant monitoring of vital signs, hemodynamics,  respiratory and cardiac monitoring, review of multiple databases, neurological assessment, discussion with family, other specialists and medical decision making of high complexity. I had long discussion with daughter and sons at bedside, updated pt current condition, treatment plan and potential prognosis. They expressed understanding and appreciation. I also discussed with Dr. Vassie LollAlva in the hallway. I spent 45 minutes of neurocritical care time in the care of this patient.  Marvel PlanJindong Takeria Marquina, MD PhD Stroke Neurology 05/28/2017 10:39 AM   To contact Stroke Continuity provider, please refer to [email or web address]. After hours, contact General Neurology

## 2017-05-28 NOTE — Progress Notes (Signed)
PULMONARY / CRITICAL CARE MEDICINE   Name: Yvonne Fields MRN: [id number] DOB: 1950/09/14    ADMISSION DATE:  June 09, 2017 CONSULTATION DATE:  June 09, 2017  REFERRING MD:  Dr. Francia Greaves hospitalist  CHIEF COMPLAINT:  Embolic stroke  HISTORY OF PRESENT ILLNESS:   66 year old female with past medical history significant for COPD, GERD, hepatitis C, congestive heart failure, and recent urinary tract infection. Also pubic rami fracture diagnosed September of this year. She presented to [facility] on 10/17 with complaints of nausea, vomiting, diarrhea, and abdominal pain 10 days. She was admitted for dehydration and associated acute kidney injury. The following day she awoke from sleep with right-sided facial droop and associated weakness. Code stroke was activated, it was felt as though she had suffered an ischemic event. She was not given TPA due to unknown time of last no well and INR greater than 2. MRI of the brain revealed multiple areas of infarction consistent with embolic disease. 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 After the TEE was hypoxemic requiring intubation.Chest x-ray at that time demonstrated left lung collapse. She underwent a bronchoscopy during which copious secretions were removed including some plugs. She was transferred to [facility] for surgical consultation.   SUBJECTIVE:  Frail female supine in bed, intubated, sedated on propofol.She does not follow commands.  VITAL SIGNS: BP (!) 125/59   Pulse (!) 117   Temp 98.9 F (37.2 C) (Core (Comment)) Comment (Src): cooling blanket  Resp 17   Ht 5\' 3"  (1.6 m)   Wt 118 lb 9.7 oz (53.8 kg)   SpO2 (!) 89%   BMI 21.01 kg/m   HEMODYNAMICS:    VENTILATOR SETTINGS: Vent Mode: PRVC FiO2 (%):  [50 %] 50 % Set Rate:  [18 bmp] 18 bmp Vt Set:  [500 mL] 500 mL PEEP:  [5 cmH20] 5 cmH20 Plateau Pressure:  [11 cmH20-22 cmH20] 22 cmH20  INTAKE /  OUTPUT: I/O last 3 completed shifts: In: 3617.8 [I.V.:2047.8; Blood:380; NG/GT:840; IV Piggyback:350] Out: 1250 [Urine:1250]  PHYSICAL EXAMINATION: General:  Frail adult-elderly female in NAD sedated on full vent support Neuro:  Flickers eyes to voice. No follow command, track or focus, Sedated. RASS 0 to  -1 HEENT:  [place]/AT, PERRL, no JVD. MM dry, OG tube, TF infusing Cardiovascular: S1, S2,  RRR, no MRG. No edema.  Lungs:  Coa, bilateral breath sounds, diminished per bases Abdomen:  Soft, non-tender, non-distended Musculoskeletal:  No acute deformity  Skin: warn, dry and intact, no lesions or rash  LABS:  BMET  Recent Labs Lab 2017-06-09 1629 05/27/17 0425 05/28/17 0450  NA 137 138 140  K 3.5 3.5 2.8*  CL 118* 111 107  CO2 13* 17* 26  BUN 54* 50* 37*  CREATININE 1.40* 1.31* 1.06*  GLUCOSE 95 139* 286*    Electrolytes  Recent Labs Lab 2017-06-09 1629 05/27/17 0425 05/28/17 0450  CALCIUM 7.5* 8.0* 7.1*  MG 1.6* 2.5* 1.9  PHOS 3.4 3.0 2.1*    CBC  Recent Labs Lab 05/27/17 0425 05/27/17 1700 05/28/17 0450  WBC 26.3* 29.1* 26.2*  HGB 8.4* 8.1* 7.6*  HCT 25.9* 24.3* 23.1*  PLT 35* 31* 18*    Coag's  Recent Labs Lab 06/09/2017 1629 05/28/17 0830  INR 1.85 1.62    Sepsis Markers  Recent Labs Lab 2017-06-09 1629 06-09-17 1716 06-09-17 1947  LATICACIDVEN  --  0.9 0.8  PROCALCITON 11.49  --   --     ABG  Recent Labs Lab May 29, 2017 1745 05/27/17 0822  PHART 7.278* 7.402  PCO2ART 31.3* 28.8*  PO2ART 88.7 64.8*    Liver Enzymes  Recent Labs Lab 05/29/2017 1629 05/27/17 0425 05/28/17 0450  AST 48* 53* 99*  ALT 25 25 30   ALKPHOS 129* 145* 134*  BILITOT 1.1 1.6* 1.6*  ALBUMIN 1.5* 1.6* 1.5*    Cardiac Enzymes No results for input(s): TROPONINI, PROBNP in the last 168 hours.  Glucose  Recent Labs Lab 05/27/17 0744 05/27/17 1605 05/27/17 2028 05/28/17 0006 05/28/17 0420 05/28/17 0801  GLUCAP 148* 173* 166* 168* 149* 178*     Imaging Dg Chest Port 1 View  Result Date: 05/28/2017 CLINICAL DATA:  Respiratory failure EXAM: PORTABLE CHEST 1 VIEW COMPARISON:  05/27/2017 and prior radiographs FINDINGS: An endotracheal tube with tip 4.5 cm above the carina, NG tube entering the stomach with tip off the field of view and right PICC line with tip overlying the mid SVC noted. Bilateral airspace opacities are stable to minimally decreased. There is no evidence of pneumothorax. No other significant changes identified. IMPRESSION: Stable to minimally decreased bilateral airspace opacities. Electronically Signed   By: Harmon Pier M.D.   On: 05/28/2017 07:09     STUDIES:  MRI Brain 10/18 > multiple areas bilateral infarct consistent with embolic disease Carotid ultrasound > unremarkable Echo 10/18 > heterogeneous density on mitral valve suggestive of vegetation, myxoma, fibroelastoma, or organized thrombus.  TEE 10/19 > LVEF 50-55%, mild to mod mitral regurg unable to rule out veg.  DG Chest 05/27/2017>>Decreased lung volumes with slightly increased bilateral airspace Opacities. CXR 05/28/2017: Stable to minimally decreased bilateral airspace opacities.  CULTURES: Blood 10/18 >GS: Aerobic gram + cocci in clusters ( Drawn at [place]) Blood  10/19> Sputum 10/19 > GS: ABUNDANT WBC PRESENT,BOTH PMN AND MONONUCLEAR  RARE GRAM POSITIVE COCCI IN PAIRS  RARE BUDDING YEAST SEEN >>Staph Aureus 10/18: + POC MSSA ( [place])  Cultures at [place] prior to transfer:  Urine c/s 10/17 negative Bronch c/s 19th - pending  ANTIBIOTICS: Zosyn 10/19> Vancomycin 10/19 >  SIGNIFICANT EVENTS: 10/17 admit for dehydration AKI 10/18 embolic CVA 10/19 ETT, intubated for hypoxia, and transfer to [facility] for mitral valve mass.   LINES/TUBES: ETT 10/19 > PICC 10/19 >  DISCUSSION: 66 year old female admitted for dehydration 6/17 and acute renal failure and unfortunately suffered CVA the following day. MRI demonstrates embolic disease. 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  ASSESSMENT / PLAN:  PULMONARY A: Acute hypoxemic respiratory failure - likely multifactorial with possible aspiration, mucous plugs. COPD without acute exacerbation Primary respiratory alkalosis with Non-Gap metabolic acidosis CXR  Stable to minimally decreased bilateral airspace opacities 10/21. 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 Hypotension likely sedation related>> resolved 10/20 History of CHF, unspecified.  Tachycardia >>?  2/2 fever Total Cholesterol < 50 at [facility] P:  Telemetry monitoring in ICU setting IVF resuscitation Maintain MAP > 65 Will need CVTS, cardiology consultation. 12 Lead EKG as needed   RENAL A:   Acute kidney injury [place] Hypokalemia>> repleted 10/21 Creatinine continued down trending 10/21 Non-gap metabolic acidosis improving  P:   DC Bicarb  Replete electrolytes as needed Follow BMP/ CMET daily Monitor urine output Avoid nephrotoxic medications  GASTROINTESTINAL A:  Nausea/vomiting/diarrhea x 10 days. ? If embolic disease to gut as well.  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  Hepatitis C T Max: 102.6 last 24 Staph Aureus per sputum  P:   ABX as above Follow micro for Blood, sputum cultures Follow WBC and fever curve. Trend temp and WBC Tylenol for temp > 101.5 Consider re-culturing for temp > 101.5 consistently if clinically indicated  ENDOCRINE A:   No acute issues A1C 5.2 prior to admission P:   Follow glucose on chemistry CBG Q4 Consider SSI if > 150 consistently  NEUROLOGIC A:   Acute CVA - multiple bilateral infarcts suggesting embolic disease.  Acute metabolic encephalopathy in setting of , CVA. Not anticoag candidate 2/2 thrombocytopenia Not following commands, but opens eyes to call of name with decrease in sedation P:   RASS goal: -1 to -2 Propofol infusion PRN fentanyl Minimize sedation as able Per neuro recs: If change in neuro exam, consider MRI or head neck vessel imaging   FAMILY  - Updates: Son at bedside 10/21: Dr. Vassie LollAlva updated  - Inter-disciplinary family meet or Palliative Care meeting due by:  10/26     Bevelyn NgoSarah F. Groce, AGACNP-BC [place] Pulmonology/Critical Care Pager [phone] or [phone]  05/28/2017 10:11 AM

## 2017-05-28 NOTE — Progress Notes (Signed)
eLink Physician-Brief Progress Note Patient Name: Yvonne GalleryJanice Marie Ledwith DOB: 1950-09-05 MRN: [id number]   Date of Service  05/28/2017  HPI/Events of Note  Low potassium  eICU Interventions  replaced     Intervention Category Minor Interventions: Electrolytes abnormality - evaluation and management  Henry RusselSMITH, Indea Dearman, P 05/28/2017, 6:14 AM

## 2017-05-28 NOTE — Progress Notes (Signed)
CRITICAL VALUE ALERT  Critical Value: platelets 18  Date & Time Notied: 0600 10/21  Provider Notified: Elink  Orders Received/Actions taken: Awaiting orders

## 2017-05-28 NOTE — Consult Note (Addendum)
[name]    Date of Admission:  06/03/2017   Total days of antibiotics 3 vanco/zosyn --> ancef               Reason for Consult: Staph bacteremia    Referring Provider: Abelina Bachelor   Assessment: Staph aureus bacteremia CNS infarcts Bilateral pneumonia (BAL prelim yeast at [place]) ? 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 Will continue ancef 2. Repeat bcx 3. Check hep vl and geno 4. hiv (-) 5. Nutrition eval 6. K repletion per primary 7. Hold antifungal rx of yeast on BAL  Discussed with family.   Thank you so much for this interesting consult,  Active Problems:   Embolic stroke ([place])   Endocarditis   Severe sepsis with acute organ dysfunction due to methicillin susceptible Staphylococcus aureus (MSSA) ([place])   MSSA bacteremia   Thrombocytopenia ([place])   . chlorhexidine gluconate (MEDLINE KIT)  15 mL Mouth Rinse BID  . Chlorhexidine Gluconate Cloth  6 each Topical Daily  . ipratropium-albuterol  3 mL Nebulization Q6H  . mouth rinse  15 mL Mouth Rinse QID  . pantoprazole (PROTONIX) IV  40 mg Intravenous Q12H  . potassium chloride  40 mEq Oral Q4H  . sodium chloride flush  3 mL Intravenous Q12H    HPI: Yvonne Fields is a 66 y.o. female with hx of COPD, Hep C (contracted 1982 during childbirth, transfusion), she was prev adm at [facility] 9-11 with pelivc fracture. She was in hospital 6 days then sent home.  She comes to [facility] on 10-17 with n/v, abd pain for 10 days. She was adm for dehydraion, AKI. She awoke the next day with R facial droop and weakness. She was not a candidate for TPA as her last known normal time was unclear. She had MRI showing embolic lesions and TEE suggesting MV lesion (fibroelastoma). She required intbx post TEE (CXR suggested collapse of L lung). She udnerwent bronch with secretions and plugging found.  After transfer, she continued to  have fever.    Review of Systems: Review of Systems  Unable to perform ROS: Critical illness  Constitutional: Positive for malaise/fatigue.  Gastrointestinal: Positive for diarrhea, nausea and vomiting.  Musculoskeletal: Positive for myalgias.    No past medical history on file.  Social History  Substance Use Topics  . Smoking status: Not on file  . Smokeless tobacco: Not on file  . Alcohol use Not on file    No family history on file.   Medications:  Scheduled: . chlorhexidine gluconate (MEDLINE KIT)  15 mL Mouth Rinse BID  . Chlorhexidine Gluconate Cloth  6 each Topical Daily  . ipratropium-albuterol  3 mL Nebulization Q6H  . mouth rinse  15 mL Mouth Rinse QID  . pantoprazole (PROTONIX) IV  40 mg Intravenous Q12H  . potassium chloride  40 mEq Oral Q4H  . sodium chloride flush  3 mL Intravenous Q12H    Abtx:  Anti-infectives    Start     Dose/Rate Route Frequency Ordered Stop   05/28/17 1000  ceFAZolin (ANCEF) IVPB 2g/100 mL premix     2 g 200 mL/hr over 30 Minutes Intravenous Every 8 hours 05/28/17 0926     05/27/17 1845  vancomycin (VANCOCIN) IVPB 750 mg/150 ml premix  Status:  Discontinued     750 mg 150 mL/hr over 60 Minutes Intravenous Every 24 hours 05/13/2017 1832 05/28/17  0801   05/20/2017 1845  piperacillin-tazobactam (ZOSYN) IVPB 3.375 g  Status:  Discontinued     3.375 g 12.5 mL/hr over 240 Minutes Intravenous Every 8 hours 05/20/2017 1832 05/28/17 0926   06/02/2017 1845  vancomycin (VANCOCIN) IVPB 1000 mg/200 mL premix     1,000 mg 200 mL/hr over 60 Minutes Intravenous  Once 05/17/2017 1832 06/06/2017 2028        OBJECTIVE: Blood pressure (!) 128/56, pulse (!) 124, temperature 98.9 F (37.2 C), temperature source Core (Comment), resp. rate (!) 32, height 5' 3"  (1.6 m), weight 53.8 kg (118 lb 9.7 oz), SpO2 92 %.  Physical Exam  Constitutional:  Intubated. Does not open eyes to voice or track.   Eyes: EOM are normal.  Neck: Neck supple.  Cardiovascular:  Normal rate, regular rhythm and normal heart sounds.   Pulmonary/Chest: She has rhonchi.  Abdominal: Soft. She exhibits distension. Bowel sounds are not hyperactive.  Musculoskeletal: She exhibits no edema.  Lymphadenopathy:    She has no cervical adenopathy.  Skin:  multiple small ecchymosis. Heels wrapped.     Lab Results Results for orders placed or performed during the hospital encounter of 05/22/2017 (from the past 48 hour(s))  MRSA PCR Screening     Status: None   Collection Time: 06/02/2017  4:07 PM  Result Value Ref Range   MRSA by PCR NEGATIVE NEGATIVE    Comment:        The GeneXpert MRSA Assay (FDA approved for NASAL specimens only), is one component of a comprehensive MRSA colonization surveillance program. It is not intended to diagnose MRSA infection nor to guide or monitor treatment for MRSA infections.   Comprehensive metabolic panel     Status: Abnormal   Collection Time: 05/24/2017  4:29 PM  Result Value Ref Range   Sodium 137 135 - 145 mmol/L   Potassium 3.5 3.5 - 5.1 mmol/L   Chloride 118 (H) 101 - 111 mmol/L   CO2 13 (L) 22 - 32 mmol/L   Glucose, Bld 95 65 - 99 mg/dL   BUN 54 (H) 6 - 20 mg/dL   Creatinine, Ser 1.40 (H) 0.44 - 1.00 mg/dL   Calcium 7.5 (L) 8.9 - 10.3 mg/dL   Total Protein 5.4 (L) 6.5 - 8.1 g/dL   Albumin 1.5 (L) 3.5 - 5.0 g/dL   AST 48 (H) 15 - 41 U/L   ALT 25 14 - 54 U/L   Alkaline Phosphatase 129 (H) 38 - 126 U/L   Total Bilirubin 1.1 0.3 - 1.2 mg/dL   GFR calc non Af Amer 39 (L) >60 mL/min   GFR calc Af Amer 45 (L) >60 mL/min    Comment: (NOTE) The eGFR has been calculated using the CKD EPI equation. This calculation has not been validated in all clinical situations. eGFR's persistently <60 mL/min signify possible Chronic Kidney Disease.    Anion gap 6 5 - 15  Magnesium     Status: Abnormal   Collection Time: 06/05/2017  4:29 PM  Result Value Ref Range   Magnesium 1.6 (L) 1.7 - 2.4 mg/dL  Phosphorus     Status: None   Collection  Time: 05/22/2017  4:29 PM  Result Value Ref Range   Phosphorus 3.4 2.5 - 4.6 mg/dL  Procalcitonin     Status: None   Collection Time: 05/25/2017  4:29 PM  Result Value Ref Range   Procalcitonin 11.49 ng/mL    Comment:        Interpretation: PCT >= 10  ng/mL: Important systemic inflammatory response, almost exclusively due to severe bacterial sepsis or septic shock. (NOTE)         ICU PCT Algorithm               Non ICU PCT Algorithm    ----------------------------     ------------------------------         PCT < 0.25 ng/mL                 PCT < 0.1 ng/mL     Stopping of antibiotics            Stopping of antibiotics       strongly encouraged.               strongly encouraged.    ----------------------------     ------------------------------       PCT level decrease by               PCT < 0.25 ng/mL       >= 80% from peak PCT       OR PCT 0.25 - 0.5 ng/mL          Stopping of antibiotics                                             encouraged.     Stopping of antibiotics           encouraged.    ----------------------------     ------------------------------       PCT level decrease by              PCT >= 0.25 ng/mL       < 80% from peak PCT        AND PCT >= 0.5 ng/mL             Continuing antibiotics                                              encouraged.       Continuing antibiotics            encouraged.    ----------------------------     ------------------------------     PCT level increase compared          PCT > 0.5 ng/mL         with peak PCT AND          PCT >= 0.5 ng/mL             Escalation of antibiotics                                          strongly encouraged.      Escalation of antibiotics        strongly encouraged.   CBC WITH DIFFERENTIAL     Status: Abnormal   Collection Time: 05/11/2017  4:29 PM  Result Value Ref Range   WBC 22.7 (H) 4.0 - 10.5 K/uL    Comment: WHITE COUNT CONFIRMED ON SMEAR   RBC 2.39 (L) 3.87 - 5.11 MIL/uL   Hemoglobin 6.4 (LL)  12.0 - 15.0 g/dL    Comment:  REPEATED TO VERIFY CRITICAL RESULT CALLED TO, READ BACK BY AND VERIFIED WITH: J. BISHOP AT 1017 ON [id number] BY D. Robinette Haines.    HCT 20.0 (L) 36.0 - 46.0 %   MCV 83.7 78.0 - 100.0 fL   MCH 26.8 26.0 - 34.0 pg   MCHC 32.0 30.0 - 36.0 g/dL   RDW 28.7 (H) 11.5 - 15.5 %   Platelets 33 (L) 150 - 400 K/uL    Comment: REPEATED TO VERIFY SPECIMEN CHECKED FOR CLOTS PLATELET COUNT CONFIRMED BY SMEAR    Neutrophils Relative % 87 %   Lymphocytes Relative 6 %   Monocytes Relative 7 %   Eosinophils Relative 0 %   Basophils Relative 0 %   nRBC 1 (H) 0 /100 WBC   Neutro Abs 19.7 (H) 1.7 - 7.7 K/uL   Lymphs Abs 1.4 0.7 - 4.0 K/uL   Monocytes Absolute 1.6 (H) 0.1 - 1.0 K/uL   Eosinophils Absolute 0.0 0.0 - 0.7 K/uL   Basophils Absolute 0.0 0.0 - 0.1 K/uL   RBC Morphology RARE NRBCs     Comment: BURR CELLS   WBC Morphology MILD LEFT SHIFT (1-5% METAS, OCC MYELO, OCC BANDS)   Protime-INR     Status: Abnormal   Collection Time: 06/07/2017  4:29 PM  Result Value Ref Range   Prothrombin Time 21.2 (H) 11.4 - 15.2 seconds   INR 1.85   Culture, respiratory (NON-Expectorated)     Status: None (Preliminary result)   Collection Time: 05/21/2017  5:10 PM  Result Value Ref Range   Specimen Description TRACHEAL ASPIRATE    Special Requests NONE    Gram Stain      ABUNDANT WBC PRESENT,BOTH PMN AND MONONUCLEAR RARE GRAM POSITIVE COCCI IN PAIRS RARE BUDDING YEAST SEEN RARE SQUAMOUS EPITHELIAL CELLS PRESENT    Culture TOO YOUNG TO READ    Report Status PENDING   Lactic acid, plasma     Status: None   Collection Time: 06/05/2017  5:16 PM  Result Value Ref Range   Lactic Acid, Venous 0.9 0.5 - 1.9 mmol/L  Urinalysis, Routine w reflex microscopic     Status: Abnormal   Collection Time: 05/25/2017  5:35 PM  Result Value Ref Range   Color, Urine YELLOW YELLOW   APPearance CLEAR CLEAR   Specific Gravity, Urine 1.013 1.005 - 1.030   pH 5.0 5.0 - 8.0   Glucose, UA NEGATIVE NEGATIVE  mg/dL   Hgb urine dipstick SMALL (A) NEGATIVE   Bilirubin Urine NEGATIVE NEGATIVE   Ketones, ur NEGATIVE NEGATIVE mg/dL   Protein, ur NEGATIVE NEGATIVE mg/dL   Nitrite NEGATIVE NEGATIVE   Leukocytes, UA TRACE (A) NEGATIVE   RBC / HPF 0-5 0 - 5 RBC/hpf   WBC, UA 0-5 0 - 5 WBC/hpf   Bacteria, UA RARE (A) NONE SEEN   Squamous Epithelial / LPF 0-5 (A) NONE SEEN   Hyaline Casts, UA PRESENT   Blood gas, arterial     Status: Abnormal   Collection Time: 05/15/2017  5:45 PM  Result Value Ref Range   FIO2 40.00    Delivery systems VENTILATOR    Mode PRESSURE REGULATED VOLUME CONTROL    VT 500 mL   LHR 18 resp/min   Peep/cpap 5.0 cm H20   pH, Arterial 7.278 (L) 7.350 - 7.450   pCO2 arterial 31.3 (L) 32.0 - 48.0 mmHg   pO2, Arterial 88.7 83.0 - 108.0 mmHg   Bicarbonate 14.2 (L) 20.0 - 28.0 mmol/L   Acid-base deficit 11.2 (  H) 0.0 - 2.0 mmol/L   O2 Saturation 95.4 %   Patient temperature 98.6    Collection site RIGHT RADIAL    Drawn by [phone]    Sample type ARTERIAL DRAW    Allens test (pass/fail) PASS PASS  Glucose, capillary     Status: None   Collection Time: 05/28/2017  6:06 PM  Result Value Ref Range   Glucose-Capillary 95 65 - 99 mg/dL   Comment 1 Capillary Specimen   Lipid panel     Status: Abnormal   Collection Time: 05/22/2017  6:32 PM  Result Value Ref Range   Cholesterol 51 0 - 200 mg/dL   Triglycerides 157 (H) <150 mg/dL   HDL <10 (L) >40 mg/dL   Total CHOL/HDL Ratio NOT CALCULATED RATIO   VLDL 31 0 - 40 mg/dL   LDL Cholesterol NOT CALCULATED 0 - 99 mg/dL  Culture, blood (routine x 2)     Status: Abnormal (Preliminary result)   Collection Time: 05/19/2017  6:37 PM  Result Value Ref Range   Specimen Description BLOOD LEFT WRIST    Special Requests IN PEDIATRIC BOTTLE Blood Culture adequate volume    Culture  Setup Time      GRAM POSITIVE COCCI IN CLUSTERS IN PEDIATRIC BOTTLE CRITICAL RESULT CALLED TO, READ BACK BY AND VERIFIED WITH: A MASTERS,PHARMD AT 1556 05/27/17 BY L  BENFIELD    Culture (A)     STAPHYLOCOCCUS AUREUS SUSCEPTIBILITIES TO FOLLOW    Report Status PENDING   Culture, blood (routine x 2)     Status: Abnormal (Preliminary result)   Collection Time: 05/25/2017  6:37 PM  Result Value Ref Range   Specimen Description BLOOD LEFT HAND    Special Requests      IN PEDIATRIC BOTTLE Blood Culture results may not be optimal due to an inadequate volume of blood received in culture bottles   Culture  Setup Time      GRAM POSITIVE COCCI IN PEDIATRIC BOTTLE CRITICAL VALUE NOTED.  VALUE IS CONSISTENT WITH PREVIOUSLY REPORTED AND CALLED VALUE.    Culture STAPHYLOCOCCUS AUREUS (A)    Report Status PENDING   Blood Culture ID Panel (Reflexed)     Status: Abnormal   Collection Time: 05/13/2017  6:37 PM  Result Value Ref Range   Enterococcus species NOT DETECTED NOT DETECTED   Listeria monocytogenes NOT DETECTED NOT DETECTED   Staphylococcus species DETECTED (A) NOT DETECTED    Comment: CRITICAL RESULT CALLED TO, READ BACK BY AND VERIFIED WITH: A MASTERS,PHARMD AT 1556 05/27/17 BY L BENFIELD    Staphylococcus aureus DETECTED (A) NOT DETECTED    Comment: Methicillin (oxacillin) susceptible Staphylococcus aureus (MSSA). Preferred therapy is anti staphylococcal beta lactam antibiotic (Cefazolin or Nafcillin), unless clinically contraindicated. CRITICAL RESULT CALLED TO, READ BACK BY AND VERIFIED WITH: A MASTERS,PHARMD AT 1556 05/27/17 BY L BENFIELD    Methicillin resistance NOT DETECTED NOT DETECTED   Streptococcus species NOT DETECTED NOT DETECTED   Streptococcus agalactiae NOT DETECTED NOT DETECTED   Streptococcus pneumoniae NOT DETECTED NOT DETECTED   Streptococcus pyogenes NOT DETECTED NOT DETECTED   Acinetobacter baumannii NOT DETECTED NOT DETECTED   Enterobacteriaceae species NOT DETECTED NOT DETECTED   Enterobacter cloacae complex NOT DETECTED NOT DETECTED   Escherichia coli NOT DETECTED NOT DETECTED   Klebsiella oxytoca NOT DETECTED NOT DETECTED     Klebsiella pneumoniae NOT DETECTED NOT DETECTED   Proteus species NOT DETECTED NOT DETECTED   Serratia marcescens NOT DETECTED NOT DETECTED   Haemophilus influenzae  NOT DETECTED NOT DETECTED   Neisseria meningitidis NOT DETECTED NOT DETECTED   Pseudomonas aeruginosa NOT DETECTED NOT DETECTED   Candida albicans NOT DETECTED NOT DETECTED   Candida glabrata NOT DETECTED NOT DETECTED   Candida krusei NOT DETECTED NOT DETECTED   Candida parapsilosis NOT DETECTED NOT DETECTED   Candida tropicalis NOT DETECTED NOT DETECTED  Lactic acid, plasma     Status: None   Collection Time: 05/21/2017  7:47 PM  Result Value Ref Range   Lactic Acid, Venous 0.8 0.5 - 1.9 mmol/L  Triglycerides     Status: None   Collection Time: 06/07/2017  7:47 PM  Result Value Ref Range   Triglycerides 124 <150 mg/dL  Type and screen [place]     Status: None   Collection Time: 05/16/2017  8:55 PM  Result Value Ref Range   ABO/RH(D) O POS    Antibody Screen NEG    Sample Expiration 05/29/2017    Unit Number [id number]    Blood Component Type RBC LR PHER2    Unit division 00    Status of Unit ISSUED,FINAL    Transfusion Status OK TO TRANSFUSE    Crossmatch Result Compatible   Prepare RBC     Status: None   Collection Time: 06/05/2017  8:55 PM  Result Value Ref Range   Order Confirmation ORDER PROCESSED BY BLOOD BANK   ABO/Rh     Status: None   Collection Time: 06/06/2017  8:55 PM  Result Value Ref Range   ABO/RH(D) O POS   Fibrinogen     Status: None   Collection Time: 05/27/2017 10:41 PM  Result Value Ref Range   Fibrinogen 428 210 - 475 mg/dL  CBC     Status: Abnormal   Collection Time: 05/27/17  4:25 AM  Result Value Ref Range   WBC 26.3 (H) 4.0 - 10.5 K/uL   RBC 3.10 (L) 3.87 - 5.11 MIL/uL   Hemoglobin 8.4 (L) 12.0 - 15.0 g/dL    Comment: REPEATED TO VERIFY DELTA CHECK NOTED POST TRANSFUSION SPECIMEN    HCT 25.9 (L) 36.0 - 46.0 %   MCV 83.5 78.0 - 100.0 fL   MCH 27.1 26.0 - 34.0 pg    MCHC 32.4 30.0 - 36.0 g/dL   RDW 25.4 (H) 11.5 - 15.5 %   Platelets 35 (L) 150 - 400 K/uL    Comment: PLATELET COUNT CONFIRMED BY SMEAR  Basic metabolic panel     Status: Abnormal   Collection Time: 05/27/17  4:25 AM  Result Value Ref Range   Sodium 138 135 - 145 mmol/L   Potassium 3.5 3.5 - 5.1 mmol/L   Chloride 111 101 - 111 mmol/L   CO2 17 (L) 22 - 32 mmol/L   Glucose, Bld 139 (H) 65 - 99 mg/dL   BUN 50 (H) 6 - 20 mg/dL   Creatinine, Ser 1.31 (H) 0.44 - 1.00 mg/dL   Calcium 8.0 (L) 8.9 - 10.3 mg/dL   GFR calc non Af Amer 42 (L) >60 mL/min   GFR calc Af Amer 49 (L) >60 mL/min    Comment: (NOTE) The eGFR has been calculated using the CKD EPI equation. This calculation has not been validated in all clinical situations. eGFR's persistently <60 mL/min signify possible Chronic Kidney Disease.    Anion gap 10 5 - 15  Magnesium     Status: Abnormal   Collection Time: 05/27/17  4:25 AM  Result Value Ref Range   Magnesium 2.5 (  H) 1.7 - 2.4 mg/dL  Phosphorus     Status: None   Collection Time: 05/27/17  4:25 AM  Result Value Ref Range   Phosphorus 3.0 2.5 - 4.6 mg/dL  Hepatic function panel     Status: Abnormal   Collection Time: 05/27/17  4:25 AM  Result Value Ref Range   Total Protein 6.5 6.5 - 8.1 g/dL   Albumin 1.6 (L) 3.5 - 5.0 g/dL   AST 53 (H) 15 - 41 U/L   ALT 25 14 - 54 U/L   Alkaline Phosphatase 145 (H) 38 - 126 U/L   Total Bilirubin 1.6 (H) 0.3 - 1.2 mg/dL   Bilirubin, Direct 0.8 (H) 0.1 - 0.5 mg/dL   Indirect Bilirubin 0.8 0.3 - 0.9 mg/dL  Hemoglobin A1c     Status: None   Collection Time: 05/27/17  4:25 AM  Result Value Ref Range   Hgb A1c MFr Bld 5.3 4.8 - 5.6 %    Comment: (NOTE) Pre diabetes:          5.7%-6.4% Diabetes:              >6.4% Glycemic control for   <7.0% adults with diabetes    Mean Plasma Glucose 105.41 mg/dL  Glucose, capillary     Status: Abnormal   Collection Time: 05/27/17  7:44 AM  Result Value Ref Range   Glucose-Capillary 148  (H) 65 - 99 mg/dL   Comment 1 Capillary Specimen    Comment 2 Notify RN   Blood gas, arterial     Status: Abnormal   Collection Time: 05/27/17  8:22 AM  Result Value Ref Range   FIO2 40.00    Delivery systems VENTILATOR    Mode PRESSURE REGULATED VOLUME CONTROL    VT 500 mL   LHR 18 resp/min   Peep/cpap 8.0 cm H20   pH, Arterial 7.402 7.350 - 7.450   pCO2 arterial 28.8 (L) 32.0 - 48.0 mmHg   pO2, Arterial 64.8 (L) 83.0 - 108.0 mmHg   Bicarbonate 17.5 (L) 20.0 - 28.0 mmol/L   Acid-base deficit 6.3 (H) 0.0 - 2.0 mmol/L   O2 Saturation 92.6 %   Patient temperature 98.6    Collection site RIGHT RADIAL    Drawn by [id number]    Sample type ARTERIAL DRAW    Allens test (pass/fail) PASS PASS  Save smear     Status: None   Collection Time: 05/27/17 12:15 PM  Result Value Ref Range   Smear Review SMEAR STAINED AND AVAILABLE FOR REVIEW   HIV antibody     Status: None   Collection Time: 05/27/17  2:30 PM  Result Value Ref Range   HIV Screen 4th Generation wRfx Non Reactive Non Reactive    Comment: (NOTE) Performed At: [facility] [place], [place] [id number] Lindon Romp MD OZ:[id number]   Glucose, capillary     Status: Abnormal   Collection Time: 05/27/17  4:05 PM  Result Value Ref Range   Glucose-Capillary 173 (H) 65 - 99 mg/dL   Comment 1 Capillary Specimen    Comment 2 Notify RN   CBC     Status: Abnormal   Collection Time: 05/27/17  5:00 PM  Result Value Ref Range   WBC 29.1 (H) 4.0 - 10.5 K/uL   RBC 2.94 (L) 3.87 - 5.11 MIL/uL   Hemoglobin 8.1 (L) 12.0 - 15.0 g/dL   HCT 24.3 (L) 36.0 - 46.0 %   MCV 82.7 78.0 - 100.0 fL  MCH 27.6 26.0 - 34.0 pg   MCHC 33.3 30.0 - 36.0 g/dL   RDW 25.9 (H) 11.5 - 15.5 %   Platelets 31 (L) 150 - 400 K/uL    Comment: REPEATED TO VERIFY PLATELET COUNT CONFIRMED BY SMEAR   Glucose, capillary     Status: Abnormal   Collection Time: 05/27/17  8:28 PM  Result Value Ref Range   Glucose-Capillary 166 (H) 65 - 99  mg/dL   Comment 1 Notify RN   Glucose, capillary     Status: Abnormal   Collection Time: 05/28/17 12:06 AM  Result Value Ref Range   Glucose-Capillary 168 (H) 65 - 99 mg/dL   Comment 1 Notify RN   Glucose, capillary     Status: Abnormal   Collection Time: 05/28/17  4:20 AM  Result Value Ref Range   Glucose-Capillary 149 (H) 65 - 99 mg/dL   Comment 1 Notify RN   CBC with Differential/Platelet     Status: Abnormal   Collection Time: 05/28/17  4:50 AM  Result Value Ref Range   WBC 26.2 (H) 4.0 - 10.5 K/uL   RBC 2.77 (L) 3.87 - 5.11 MIL/uL   Hemoglobin 7.6 (L) 12.0 - 15.0 g/dL   HCT 23.1 (L) 36.0 - 46.0 %   MCV 83.4 78.0 - 100.0 fL   MCH 27.4 26.0 - 34.0 pg   MCHC 32.9 30.0 - 36.0 g/dL   RDW 26.3 (H) 11.5 - 15.5 %   Platelets 18 (LL) 150 - 400 K/uL    Comment: REPEATED TO VERIFY SPECIMEN CHECKED FOR CLOTS PLATELET COUNT CONFIRMED BY SMEAR CRITICAL RESULT CALLED TO, READ BACK BY AND VERIFIED WITH: P. DICKINSON,RN 0602 05/28/2017 T. TYSOR    Neutrophils Relative % 84 %   Lymphocytes Relative 10 %   Monocytes Relative 6 %   Eosinophils Relative 0 %   Basophils Relative 0 %   Neutro Abs 22.0 (H) 1.7 - 7.7 K/uL   Lymphs Abs 2.6 0.7 - 4.0 K/uL   Monocytes Absolute 1.6 (H) 0.1 - 1.0 K/uL   Eosinophils Absolute 0.0 0.0 - 0.7 K/uL   Basophils Absolute 0.0 0.0 - 0.1 K/uL   RBC Morphology TARGET CELLS     Comment: RARE NRBCs   WBC Morphology      MODERATE LEFT SHIFT (>5% METAS AND MYELOS,OCC PRO NOTED)  Magnesium     Status: None   Collection Time: 05/28/17  4:50 AM  Result Value Ref Range   Magnesium 1.9 1.7 - 2.4 mg/dL  Phosphorus     Status: Abnormal   Collection Time: 05/28/17  4:50 AM  Result Value Ref Range   Phosphorus 2.1 (L) 2.5 - 4.6 mg/dL  Comprehensive metabolic panel     Status: Abnormal   Collection Time: 05/28/17  4:50 AM  Result Value Ref Range   Sodium 140 135 - 145 mmol/L   Potassium 2.8 (L) 3.5 - 5.1 mmol/L    Comment: DELTA CHECK NOTED   Chloride 107 101  - 111 mmol/L   CO2 26 22 - 32 mmol/L   Glucose, Bld 286 (H) 65 - 99 mg/dL   BUN 37 (H) 6 - 20 mg/dL   Creatinine, Ser 1.06 (H) 0.44 - 1.00 mg/dL   Calcium 7.1 (L) 8.9 - 10.3 mg/dL   Total Protein 5.7 (L) 6.5 - 8.1 g/dL   Albumin 1.5 (L) 3.5 - 5.0 g/dL   AST 99 (H) 15 - 41 U/L   ALT 30 14 - 54 U/L  Alkaline Phosphatase 134 (H) 38 - 126 U/L   Total Bilirubin 1.6 (H) 0.3 - 1.2 mg/dL   GFR calc non Af Amer 54 (L) >60 mL/min   GFR calc Af Amer >60 >60 mL/min    Comment: (NOTE) The eGFR has been calculated using the CKD EPI equation. This calculation has not been validated in all clinical situations. eGFR's persistently <60 mL/min signify possible Chronic Kidney Disease.    Anion gap 7 5 - 15  Glucose, capillary     Status: Abnormal   Collection Time: 05/28/17  8:01 AM  Result Value Ref Range   Glucose-Capillary 178 (H) 65 - 99 mg/dL   Comment 1 Capillary Specimen    Comment 2 Notify RN   Protime-INR     Status: Abnormal   Collection Time: 05/28/17  8:30 AM  Result Value Ref Range   Prothrombin Time 19.1 (H) 11.4 - 15.2 seconds   INR 1.62       Component Value Date/Time   SDES BLOOD LEFT WRIST 05/27/2017 1837   SDES BLOOD LEFT HAND 05/08/2017 1837   SPECREQUEST IN PEDIATRIC BOTTLE Blood Culture adequate volume 05/30/2017 1837   SPECREQUEST  05/22/2017 1837    IN PEDIATRIC BOTTLE Blood Culture results may not be optimal due to an inadequate volume of blood received in culture bottles   CULT (A) 05/08/2017 1837    STAPHYLOCOCCUS AUREUS SUSCEPTIBILITIES TO FOLLOW    CULT STAPHYLOCOCCUS AUREUS (A) 05/21/2017 1837   REPTSTATUS PENDING 05/21/2017 1837   REPTSTATUS PENDING 06/06/2017 1837   Dg Chest Port 1 View  Result Date: 05/28/2017 CLINICAL DATA:  Respiratory failure EXAM: PORTABLE CHEST 1 VIEW COMPARISON:  05/27/2017 and prior radiographs FINDINGS: An endotracheal tube with tip 4.5 cm above the carina, NG tube entering the stomach with tip off the field of view and right  PICC line with tip overlying the mid SVC noted. Bilateral airspace opacities are stable to minimally decreased. There is no evidence of pneumothorax. No other significant changes identified. IMPRESSION: Stable to minimally decreased bilateral airspace opacities. Electronically Signed   By: Margarette Canada M.D.   On: 05/28/2017 07:09   Dg Chest Port 1 View  Result Date: 05/27/2017 CLINICAL DATA:  Embolic stroke and respiratory failure. EXAM: PORTABLE CHEST 1 VIEW COMPARISON:  05/15/2017 and prior radiographs FINDINGS: Endotracheal tube images not significantly changed with tip 4.5 cm above the carina. An NG tube enters the stomach with tip off the field of view. A right PICC line is present with tip overlying the superior cavoatrial junction. Decreased lung volumes with slightly increased bilateral airspace opacities. No evidence of pneumothorax. IMPRESSION: 1. Support apparatus as described. Endotracheal tube with tip 4.5 cm above the carina. 2. Decreased lung volumes with slightly increased bilateral airspace opacities. Electronically Signed   By: Margarette Canada M.D.   On: 05/27/2017 06:58   Dg Chest Port 1 View  Result Date: 06/07/2017 CLINICAL DATA:  Respiratory failure. EXAM: PORTABLE CHEST 1 VIEW COMPARISON:  05/25/2017. FINDINGS: Endotracheal tube and right PICC line stable position. Interim placement NG tube, its tip is below left hemidiaphragm. Heart size stable. Interim improved aeration left lung with resolution of left upper and lower lobe atelectasis. Multifocal bilateral pulmonary infiltrates/edema again noted. No pneumothorax. IMPRESSION: 1. Interim placement of NG tube, its tip is below left hemidiaphragm. Endotracheal tube and right PICC line stable position. 2. Interim resolution of atelectatic changes throughout the left lung. 3.  Persistent multifocal bilateral pulmonary infiltrates/edema . Electronically Signed   By:  Bagley   On: 05/31/2017 17:03   Recent Results (from the past 240  hour(s))  MRSA PCR Screening     Status: None   Collection Time: 05/17/2017  4:07 PM  Result Value Ref Range Status   MRSA by PCR NEGATIVE NEGATIVE Final    Comment:        The GeneXpert MRSA Assay (FDA approved for NASAL specimens only), is one component of a comprehensive MRSA colonization surveillance program. It is not intended to diagnose MRSA infection nor to guide or monitor treatment for MRSA infections.   Culture, respiratory (NON-Expectorated)     Status: None (Preliminary result)   Collection Time: 05/14/2017  5:10 PM  Result Value Ref Range Status   Specimen Description TRACHEAL ASPIRATE  Final   Special Requests NONE  Final   Gram Stain   Final    ABUNDANT WBC PRESENT,BOTH PMN AND MONONUCLEAR RARE GRAM POSITIVE COCCI IN PAIRS RARE BUDDING YEAST SEEN RARE SQUAMOUS EPITHELIAL CELLS PRESENT    Culture TOO YOUNG TO READ  Final   Report Status PENDING  Incomplete  Culture, blood (routine x 2)     Status: Abnormal (Preliminary result)   Collection Time: 05/30/2017  6:37 PM  Result Value Ref Range Status   Specimen Description BLOOD LEFT WRIST  Final   Special Requests IN PEDIATRIC BOTTLE Blood Culture adequate volume  Final   Culture  Setup Time   Final    GRAM POSITIVE COCCI IN CLUSTERS IN PEDIATRIC BOTTLE CRITICAL RESULT CALLED TO, READ BACK BY AND VERIFIED WITH: A MASTERS,PHARMD AT 1556 05/27/17 BY L BENFIELD    Culture (A)  Final    STAPHYLOCOCCUS AUREUS SUSCEPTIBILITIES TO FOLLOW    Report Status PENDING  Incomplete  Culture, blood (routine x 2)     Status: Abnormal (Preliminary result)   Collection Time: 06/03/2017  6:37 PM  Result Value Ref Range Status   Specimen Description BLOOD LEFT HAND  Final   Special Requests   Final    IN PEDIATRIC BOTTLE Blood Culture results may not be optimal due to an inadequate volume of blood received in culture bottles   Culture  Setup Time   Final    GRAM POSITIVE COCCI IN PEDIATRIC BOTTLE CRITICAL VALUE NOTED.  VALUE IS  CONSISTENT WITH PREVIOUSLY REPORTED AND CALLED VALUE.    Culture STAPHYLOCOCCUS AUREUS (A)  Final   Report Status PENDING  Incomplete  Blood Culture ID Panel (Reflexed)     Status: Abnormal   Collection Time: 06/07/2017  6:37 PM  Result Value Ref Range Status   Enterococcus species NOT DETECTED NOT DETECTED Final   Listeria monocytogenes NOT DETECTED NOT DETECTED Final   Staphylococcus species DETECTED (A) NOT DETECTED Final    Comment: CRITICAL RESULT CALLED TO, READ BACK BY AND VERIFIED WITH: A MASTERS,PHARMD AT 1556 05/27/17 BY L BENFIELD    Staphylococcus aureus DETECTED (A) NOT DETECTED Final    Comment: Methicillin (oxacillin) susceptible Staphylococcus aureus (MSSA). Preferred therapy is anti staphylococcal beta lactam antibiotic (Cefazolin or Nafcillin), unless clinically contraindicated. CRITICAL RESULT CALLED TO, READ BACK BY AND VERIFIED WITH: A MASTERS,PHARMD AT 1556 05/27/17 BY L BENFIELD    Methicillin resistance NOT DETECTED NOT DETECTED Final   Streptococcus species NOT DETECTED NOT DETECTED Final   Streptococcus agalactiae NOT DETECTED NOT DETECTED Final   Streptococcus pneumoniae NOT DETECTED NOT DETECTED Final   Streptococcus pyogenes NOT DETECTED NOT DETECTED Final   Acinetobacter baumannii NOT DETECTED NOT DETECTED Final   Enterobacteriaceae  species NOT DETECTED NOT DETECTED Final   Enterobacter cloacae complex NOT DETECTED NOT DETECTED Final   Escherichia coli NOT DETECTED NOT DETECTED Final   Klebsiella oxytoca NOT DETECTED NOT DETECTED Final   Klebsiella pneumoniae NOT DETECTED NOT DETECTED Final   Proteus species NOT DETECTED NOT DETECTED Final   Serratia marcescens NOT DETECTED NOT DETECTED Final   Haemophilus influenzae NOT DETECTED NOT DETECTED Final   Neisseria meningitidis NOT DETECTED NOT DETECTED Final   Pseudomonas aeruginosa NOT DETECTED NOT DETECTED Final   Candida albicans NOT DETECTED NOT DETECTED Final   Candida glabrata NOT DETECTED NOT DETECTED  Final   Candida krusei NOT DETECTED NOT DETECTED Final   Candida parapsilosis NOT DETECTED NOT DETECTED Final   Candida tropicalis NOT DETECTED NOT DETECTED Final    Microbiology: Recent Results (from the past 240 hour(s))  MRSA PCR Screening     Status: None   Collection Time: 05/17/2017  4:07 PM  Result Value Ref Range Status   MRSA by PCR NEGATIVE NEGATIVE Final    Comment:        The GeneXpert MRSA Assay (FDA approved for NASAL specimens only), is one component of a comprehensive MRSA colonization surveillance program. It is not intended to diagnose MRSA infection nor to guide or monitor treatment for MRSA infections.   Culture, respiratory (NON-Expectorated)     Status: None (Preliminary result)   Collection Time: 05/25/2017  5:10 PM  Result Value Ref Range Status   Specimen Description TRACHEAL ASPIRATE  Final   Special Requests NONE  Final   Gram Stain   Final    ABUNDANT WBC PRESENT,BOTH PMN AND MONONUCLEAR RARE GRAM POSITIVE COCCI IN PAIRS RARE BUDDING YEAST SEEN RARE SQUAMOUS EPITHELIAL CELLS PRESENT    Culture TOO YOUNG TO READ  Final   Report Status PENDING  Incomplete  Culture, blood (routine x 2)     Status: Abnormal (Preliminary result)   Collection Time: 05/29/2017  6:37 PM  Result Value Ref Range Status   Specimen Description BLOOD LEFT WRIST  Final   Special Requests IN PEDIATRIC BOTTLE Blood Culture adequate volume  Final   Culture  Setup Time   Final    GRAM POSITIVE COCCI IN CLUSTERS IN PEDIATRIC BOTTLE CRITICAL RESULT CALLED TO, READ BACK BY AND VERIFIED WITH: A MASTERS,PHARMD AT 1556 05/27/17 BY L BENFIELD    Culture (A)  Final    STAPHYLOCOCCUS AUREUS SUSCEPTIBILITIES TO FOLLOW    Report Status PENDING  Incomplete  Culture, blood (routine x 2)     Status: Abnormal (Preliminary result)   Collection Time: 06/06/2017  6:37 PM  Result Value Ref Range Status   Specimen Description BLOOD LEFT HAND  Final   Special Requests   Final    IN PEDIATRIC  BOTTLE Blood Culture results may not be optimal due to an inadequate volume of blood received in culture bottles   Culture  Setup Time   Final    GRAM POSITIVE COCCI IN PEDIATRIC BOTTLE CRITICAL VALUE NOTED.  VALUE IS CONSISTENT WITH PREVIOUSLY REPORTED AND CALLED VALUE.    Culture STAPHYLOCOCCUS AUREUS (A)  Final   Report Status PENDING  Incomplete  Blood Culture ID Panel (Reflexed)     Status: Abnormal   Collection Time: 05/27/2017  6:37 PM  Result Value Ref Range Status   Enterococcus species NOT DETECTED NOT DETECTED Final   Listeria monocytogenes NOT DETECTED NOT DETECTED Final   Staphylococcus species DETECTED (A) NOT DETECTED Final    Comment: CRITICAL  RESULT CALLED TO, READ BACK BY AND VERIFIED WITH: A MASTERS,PHARMD AT 1556 05/27/17 BY L BENFIELD    Staphylococcus aureus DETECTED (A) NOT DETECTED Final    Comment: Methicillin (oxacillin) susceptible Staphylococcus aureus (MSSA). Preferred therapy is anti staphylococcal beta lactam antibiotic (Cefazolin or Nafcillin), unless clinically contraindicated. CRITICAL RESULT CALLED TO, READ BACK BY AND VERIFIED WITH: A MASTERS,PHARMD AT 1556 05/27/17 BY L BENFIELD    Methicillin resistance NOT DETECTED NOT DETECTED Final   Streptococcus species NOT DETECTED NOT DETECTED Final   Streptococcus agalactiae NOT DETECTED NOT DETECTED Final   Streptococcus pneumoniae NOT DETECTED NOT DETECTED Final   Streptococcus pyogenes NOT DETECTED NOT DETECTED Final   Acinetobacter baumannii NOT DETECTED NOT DETECTED Final   Enterobacteriaceae species NOT DETECTED NOT DETECTED Final   Enterobacter cloacae complex NOT DETECTED NOT DETECTED Final   Escherichia coli NOT DETECTED NOT DETECTED Final   Klebsiella oxytoca NOT DETECTED NOT DETECTED Final   Klebsiella pneumoniae NOT DETECTED NOT DETECTED Final   Proteus species NOT DETECTED NOT DETECTED Final   Serratia marcescens NOT DETECTED NOT DETECTED Final   Haemophilus influenzae NOT DETECTED NOT  DETECTED Final   Neisseria meningitidis NOT DETECTED NOT DETECTED Final   Pseudomonas aeruginosa NOT DETECTED NOT DETECTED Final   Candida albicans NOT DETECTED NOT DETECTED Final   Candida glabrata NOT DETECTED NOT DETECTED Final   Candida krusei NOT DETECTED NOT DETECTED Final   Candida parapsilosis NOT DETECTED NOT DETECTED Final   Candida tropicalis NOT DETECTED NOT DETECTED Final    Radiographs and labs were personally reviewed by me.        [place] [facility] Staphylococcus aureus bacteremia   Staphylococcus aureus bacteremia (SAB) is associated with a high rate of complications and mortality.  Specific aspects of clinical management are critical to optimizing the outcome of patients with SAB.  Therefore, the [facility] Antimicrobial Management Team [facility]) has initiated an intervention aimed at improving the management of SAB at [facility].  To do so, Infectious Diseases physicians are providing an evidence-based consult for the management of all patients with SAB.     Yes No Comments  Perform follow-up blood cultures (even if the patient is afebrile) to ensure clearance of bacteremia [x]  []    Remove vascular catheter and obtain follow-up blood cultures after the removal of the catheter []  []    Perform echocardiography to evaluate for endocarditis (transthoracic ECHO is 40-50% sensitive, TEE is > 90% sensitive) [x]  []  Please keep in mind, that neither test can definitively EXCLUDE endocarditis, and that should clinical suspicion remain high for endocarditis the patient should then still be treated with an "endocarditis" duration of therapy = 6 weeks  Consult electrophysiologist to evaluate implanted cardiac device (pacemaker, ICD) []  []    Ensure source control []  []  Have all abscesses been drained effectively? Have deep seeded infections (septic joints or osteomyelitis) had appropriate surgical debridement?  Investigate for "metastatic" sites of infection [x]   []  Does the patient have ANY symptom or physical exam finding that would suggest a deeper infection (back or neck pain that may be suggestive of vertebral osteomyelitis or epidural abscess, muscle pain that could be a symptom of pyomyositis)?  Keep in mind that for deep seeded infections MRI imaging with contrast is preferred rather than other often insensitive tests such as plain x-rays, especially early in a patient's presentation.  Change antibiotic therapy to __________________ [x]  []  Beta-lactam antibiotics are preferred for MSSA due to higher cure rates.   If  on Vancomycin, goal trough should be 15 - 20 mcg/mL  Estimated duration of IV antibiotic therapy:   []  []  Consult case management for probably prolonged outpatient IV antibiotic therapy      Bobby Rumpf, MD [facility] for Infect[facility] [place] [phone] 05/28/2017, 10:42 AM

## 2017-05-29 ENCOUNTER — Inpatient Hospital Stay (HOSPITAL_COMMUNITY): Payer: Medicaid Other

## 2017-05-29 ENCOUNTER — Encounter (HOSPITAL_COMMUNITY): Payer: Self-pay | Admitting: General Practice

## 2017-05-29 DIAGNOSIS — B9561 Methicillin susceptible Staphylococcus aureus infection as the cause of diseases classified elsewhere: Secondary | ICD-10-CM

## 2017-05-29 DIAGNOSIS — K567 Ileus, unspecified: Secondary | ICD-10-CM

## 2017-05-29 DIAGNOSIS — D72829 Elevated white blood cell count, unspecified: Secondary | ICD-10-CM

## 2017-05-29 DIAGNOSIS — R140 Abdominal distension (gaseous): Secondary | ICD-10-CM

## 2017-05-29 LAB — BASIC METABOLIC PANEL
ANION GAP: 8 (ref 5–15)
Anion gap: 6 (ref 5–15)
BUN: 36 mg/dL — ABNORMAL HIGH (ref 6–20)
BUN: 37 mg/dL — ABNORMAL HIGH (ref 6–20)
CALCIUM: 7.7 mg/dL — AB (ref 8.9–10.3)
CHLORIDE: 111 mmol/L (ref 101–111)
CO2: 23 mmol/L (ref 22–32)
CO2: 23 mmol/L (ref 22–32)
CREATININE: 0.91 mg/dL (ref 0.44–1.00)
Calcium: 7.7 mg/dL — ABNORMAL LOW (ref 8.9–10.3)
Chloride: 113 mmol/L — ABNORMAL HIGH (ref 101–111)
Creatinine, Ser: 0.89 mg/dL (ref 0.44–1.00)
GFR calc Af Amer: >60 mL/min (ref >60)
GFR calc Af Amer: >60 mL/min (ref >60)
GFR calc non Af Amer: >60 mL/min (ref >60)
GLUCOSE: 165 mg/dL — AB (ref 65–99)
Glucose, Bld: 169 mg/dL — ABNORMAL HIGH (ref 65–99)
Potassium: 5.2 mmol/L — ABNORMAL HIGH (ref 3.5–5.1)
Potassium: 4.4 mmol/L (ref 3.5–5.1)
Sodium: 142 mmol/L (ref 135–145)
Sodium: 142 mmol/L (ref 135–145)

## 2017-05-29 LAB — CBC WITH DIFFERENTIAL/PLATELET
BASOS PCT: 0 %
Basophils Absolute: 0 10*3/uL (ref 0.0–0.1)
EOS ABS: 0.4 10*3/uL (ref 0.0–0.7)
Eosinophils Relative: 1 %
HCT: 25.8 % — ABNORMAL LOW (ref 36.0–46.0)
HEMOGLOBIN: 8.4 g/dL — AB (ref 12.0–15.0)
LYMPHS ABS: 2.9 10*3/uL (ref 0.7–4.0)
Lymphocytes Relative: 8 %
MCH: 27.6 pg (ref 26.0–34.0)
MCHC: 32.6 g/dL (ref 30.0–36.0)
MCV: 84.9 fL (ref 78.0–100.0)
MONO ABS: 1.8 10*3/uL — AB (ref 0.1–1.0)
Monocytes Relative: 5 %
NEUTROS ABS: 31 10*3/uL — AB (ref 1.7–7.7)
Neutrophils Relative %: 86 %
PLATELETS: 28 10*3/uL — AB (ref 150–400)
RBC: 3.04 MIL/uL — ABNORMAL LOW (ref 3.87–5.11)
RDW: 26.7 % — ABNORMAL HIGH (ref 11.5–15.5)
WBC: 36.1 10*3/uL — ABNORMAL HIGH (ref 4.0–10.5)

## 2017-05-29 LAB — GLUCOSE, CAPILLARY
GLUCOSE-CAPILLARY: 172 mg/dL — AB (ref 65–99)
GLUCOSE-CAPILLARY: 157 mg/dL — AB (ref 65–99)
GLUCOSE-CAPILLARY: 133 mg/dL — AB (ref 65–99)
Glucose-Capillary: 153 mg/dL — ABNORMAL HIGH (ref 65–99)
Glucose-Capillary: 149 mg/dL — ABNORMAL HIGH (ref 65–99)

## 2017-05-29 LAB — CULTURE, BLOOD (ROUTINE X 2): Special Requests: ADEQUATE

## 2017-05-29 LAB — CULTURE, RESPIRATORY W GRAM STAIN: Culture: NORMAL

## 2017-05-29 LAB — MAGNESIUM: Magnesium: 2 mg/dL (ref 1.7–2.4)

## 2017-05-29 LAB — TRIGLYCERIDES: TRIGLYCERIDES: 98 mg/dL (ref <150)

## 2017-05-29 LAB — HEPARIN INDUCED PLATELET AB (HIT ANTIBODY): Heparin Induced Plt Ab: 0.263 OD (ref 0.000–0.400)

## 2017-05-29 LAB — PHOSPHORUS: Phosphorus: 1.8 mg/dL — ABNORMAL LOW (ref 2.5–4.6)

## 2017-05-29 LAB — PROTIME-INR
INR: 1.49
Prothrombin Time: 17.9 seconds — ABNORMAL HIGH (ref 11.4–15.2)

## 2017-05-29 MED ORDER — METOPROLOL TARTRATE 5 MG/5ML IV SOLN
5.0000 mg | Freq: Once | INTRAVENOUS | Status: AC
  Administered 2017-05-29: 5 mg via INTRAVENOUS

## 2017-05-29 MED ORDER — METOPROLOL TARTRATE 5 MG/5ML IV SOLN
INTRAVENOUS | Status: AC
  Filled 2017-05-29: qty 5

## 2017-05-29 MED ORDER — INSULIN ASPART 100 UNIT/ML ~~LOC~~ SOLN
0.0000 [IU] | SUBCUTANEOUS | Status: DC
  Administered 2017-05-29 (×2): 1 [IU] via SUBCUTANEOUS
  Administered 2017-05-30: 2 [IU] via SUBCUTANEOUS
  Administered 2017-05-30 – 2017-05-31 (×8): 1 [IU] via SUBCUTANEOUS

## 2017-05-29 MED ORDER — ORAL CARE MOUTH RINSE
15.0000 mL | OROMUCOSAL | Status: DC
  Administered 2017-05-29 – 2017-05-31 (×15): 15 mL via OROMUCOSAL

## 2017-05-29 MED ORDER — NAFCILLIN SODIUM 2 G IJ SOLR
2.0000 g | INTRAVENOUS | Status: DC
  Administered 2017-05-29 – 2017-05-31 (×14): 2 g via INTRAVENOUS
  Filled 2017-05-29 (×15): qty 2000

## 2017-05-29 MED ORDER — CHLORHEXIDINE GLUCONATE 0.12% ORAL RINSE (MEDLINE KIT)
15.0000 mL | Freq: Two times a day (BID) | OROMUCOSAL | Status: DC
  Administered 2017-05-29 – 2017-05-31 (×4): 15 mL via OROMUCOSAL

## 2017-05-29 MED ORDER — SODIUM PHOSPHATES 45 MMOLE/15ML IV SOLN
10.0000 mmol | Freq: Once | INTRAVENOUS | Status: AC
  Administered 2017-05-29: 10 mmol via INTRAVENOUS
  Filled 2017-05-29 (×2): qty 3.33

## 2017-05-29 MED ORDER — FUROSEMIDE 10 MG/ML IJ SOLN
40.0000 mg | Freq: Two times a day (BID) | INTRAMUSCULAR | Status: DC
  Administered 2017-05-29 – 2017-05-30 (×3): 40 mg via INTRAVENOUS
  Filled 2017-05-29 (×3): qty 4

## 2017-05-29 NOTE — Progress Notes (Signed)
PULMONARY / CRITICAL CARE MEDICINE   Name: Yvonne GalleryJanice Marie Fields MRN: [id number] DOB: 1950/11/16    ADMISSION DATE:  05/21/2017 CONSULTATION DATE:  06/05/2017  REFERRING MD:  Dr. Francia GreavesGoodrich, [place] hospitalist  CHIEF COMPLAINT:  Embolic stroke  HISTORY OF PRESENT ILLNESS:   66 year old female with past medical history significant for COPD, GERD, hepatitis C, congestive heart failure, and recent urinary tract infection. Also pubic rami fracture diagnosed September of this year. [facility] with complaints of nausea, vomiting, diarrhea, and abdominal pain 10 days. She was admitted for dehydration and associated acute kidney injury. The following day she awoke from sleep with right-sided facial droop and associated weakness. Code stroke was activated, it was felt as though she had suffered an ischemic event. She was not given TPA due to unknown time of last no well and INR greater than 2. MRI of the brain revealed multiple areas of infarction consistent with embolic disease. 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 After the TEE was hypoxemic requiring intubation.Chest x-ray at that time demonstrated left lung collapse. She underwent a bronchoscopy during which copious secretions were removed including some plugs. [facility] consultation.   SUBJECTIVE:  No acute events.  Vitals stable.   VITAL SIGNS: BP (!) 141/56   Pulse (!) 121   Temp 98.9 F (37.2 C) (Core (Comment))   Resp 20   Ht 5\' 3"  (1.6 m)   Wt 57.5 kg (126 lb 12.2 oz)   SpO2 96%   BMI 22.46 kg/m   HEMODYNAMICS:    VENTILATOR SETTINGS: Vent Mode: PRVC FiO2 (%):  [40 %-70 %] 40 % Set Rate:  [18 bmp] 18 bmp Vt Set:  [500 mL] 500 mL PEEP:  [10 cmH20] 10 cmH20 Plateau Pressure:  [17 cmH20-26 cmH20] 23 cmH20  INTAKE / OUTPUT: I/O last 3 completed shifts: In: 3039.8 [I.V.:939.8; NG/GT:1850; IV  Piggyback:250] Out: 1740 [Urine:1740]  PHYSICAL EXAMINATION: General:  Frail adult-elderly female in NAD sedated on full vent support Neuro:  RASS -3.  Not following commands. HEENT:  [place]/AT, PERRL, dried blood in oral cavity. OG tube, TF infusing.  ETT in place. Cardiovascular: S1, S2,  RRR, no MRG. No edema.  Lungs:  Coa, bilateral breath sounds, diminished bilateral bases. Abdomen:  Soft, non-tender, non-distended. Musculoskeletal:  No acute deformity, no edema. Skin: warm, dry and intact, no lesions or rash.  LABS:  BMET  Recent Labs Lab 05/28/17 0450 05/28/17 1130 05/29/17 0346  NA 140 139 142  K 2.8* 4.9 5.2*  CL 107 112* 113*  CO2 26 23 23   BUN 37* 37* 36*  CREATININE 1.06* 1.10* 0.91  GLUCOSE 286* 191* 165*    Electrolytes  Recent Labs Lab 05/27/17 0425 05/28/17 0450 05/28/17 1130 05/29/17 0346  CALCIUM 8.0* 7.1* 7.4* 7.7*  MG 2.5* 1.9  --  2.0  PHOS 3.0 2.1*  --  1.8*    CBC  Recent Labs Lab 05/28/17 0450 05/28/17 1130 05/29/17 0346  WBC 26.2* 31.2* 36.1*  HGB 7.6* 8.1* 8.4*  HCT 23.1* 24.6* 25.8*  PLT 18* 22* 28*    Coag's  Recent Labs Lab 06/03/2017 1629 05/28/17 0830 05/29/17 0346  INR 1.85 1.62 1.49    Sepsis Markers  Recent Labs Lab 05/25/2017 1629 05/21/2017 1716 05/12/2017 1947  LATICACIDVEN  --  0.9 0.8  PROCALCITON 11.49  --   --     ABG  Recent Labs  Lab 06/07/2017 1745 05/27/17 0822  PHART 7.278* 7.402  PCO2ART 31.3* 28.8*  PO2ART 88.7 64.8*    Liver Enzymes  Recent Labs Lab 06/05/2017 1629 05/27/17 0425 05/28/17 0450  AST 48* 53* 99*  ALT 25 25 30   ALKPHOS 129* 145* 134*  BILITOT 1.1 1.6* 1.6*  ALBUMIN 1.5* 1.6* 1.5*    Cardiac Enzymes No results for input(s): TROPONINI, PROBNP in the last 168 hours.  Glucose  Recent Labs Lab 05/28/17 1215 05/28/17 1610 05/28/17 2029 05/28/17 2352 05/29/17 0419 05/29/17 0738  GLUCAP 176* 179* 138* 120* 172* 157*    Imaging Dg Chest Port 1 View  Result  Date: 05/29/2017 CLINICAL DATA:  Hypoxia EXAM: PORTABLE CHEST 1 VIEW COMPARISON:  May 28, 2017 FINDINGS: Endotracheal tube tip is 6.2 cm above the carina. Central catheter tip is in the superior vena cava. Nasogastric tube tip and side port are below the diaphragm. No pneumothorax. There is somewhat less airspace consolidation in left mid lower lung zones compared to 1 day prior. Consolidation remains in the right base region without appreciable change. There is no new opacity evident. Heart is upper normal in size with pulmonary vascularity within normal limits. There is aortic atherosclerosis. No adenopathy. No evident bone lesions. IMPRESSION: Tube and catheter positions as described without pneumothorax. Airspace consolidation bilaterally with partial clearing on the left and no change on the right. No new opacity. Heart upper normal in size. There is aortic atherosclerosis. Aortic Atherosclerosis (ICD10-I70.0). Electronically Signed   By: Bretta Bang III M.D.   On: 05/29/2017 07:11     STUDIES:  MRI Brain 10/18 > multiple areas bilateral infarct consistent with embolic disease Carotid ultrasound > unremarkable Echo 10/18 > heterogeneous density on mitral valve suggestive of vegetation, myxoma, fibroelastoma, or organized thrombus.  TEE 10/19 > LVEF 50-55%, mild to mod mitral regurg unable to rule out veg.  DG Chest 05/27/2017>>Decreased lung volumes with slightly increased bilateral airspace Opacities. CXR 05/28/2017: Stable to minimally decreased bilateral airspace opacities.  CULTURES: Blood 10/18 >GS: Aerobic gram + cocci in clusters ( Drawn at [place]) Blood  10/19> Staph Aureus Blood 10/21 >  Sputum 10/19 > GS: ABUNDANT WBC PRESENT,BOTH PMN AND MONONUCLEAR  RARE GRAM POSITIVE COCCI IN PAIRS  RARE BUDDING YEAST SEEN 10/18: + POC MSSA ( [place])  Cultures at [place] prior to transfer:  Urine c/s 10/17 negative Bronch c/s 19th - pending  ANTIBIOTICS: Zosyn 10/19 >  10/21 Vancomycin 10/19 > 10/21  Ancef 10/21 >   SIGNIFICANT EVENTS: 10/17 admit for dehydration AKI 10/18 embolic CVA 10/19 ETT, intubated for hypoxia, and transfer to [facility] for mitral valve mass.   LINES/TUBES: ETT 10/19 > PICC 10/19 >  DISCUSSION: 66 year old female admitted for dehydration 6/17 and acute renal failure and unfortunately suffered CVA the following day. MRI demonstrates embolic disease. 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  2/2 fever Total Cholesterol < 50 at [place] P:  ID following Monitor hemodynamics Case discussed with cards / CVTS - not a candidate for surgery due to poor medical condition  RENAL A:   Acute kidney injury - resolved Hypomag - resolved Hypokalemia - resolved NAGMA - resolved  Hypophosphatemia P:   Replete electrolytes as needed Follow BMP/ CMET daily Monitor urine output Avoid nephrotoxic medications  GASTROINTESTINAL A:   Nausea/vomiting/diarrhea x 10 days. ? If embolic disease to gut as well Cirrhosis (radiographic evidence) with known hepatitis C P:   Tube Feeds as tolerated PPI: Protonix Guaiac stools  HEMATOLOGIC A:   Anemia - transfused 1u PRBC 10/20 Thrombocytopenia - Acute on chronic.  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 Vassie Loll updated  - Inter-disciplinary family meet or Palliative Care meeting due by:  10/26   CC time: 30 min.   Rutherford Guys, [place] [facility] Pager: [phone]  or [phone] 05/29/2017, 8:12 AM   STAFF NOTE: I, Rory Percy, MD FACP have personally reviewed patient's available data, including medical history, events of note, physical examination and test results as part of my evaluation. I have discussed with resident/NP and other care providers such as pharmacist, RN and RRT. 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, possible aspiration peri tee, was neg with balance, would prefer neg balance further, lasix consideration, K repeat in pm , phos supp, persistent WBC concerning, some hemoconcentration explains, thrombocytopenia from hep c and now fractionating off valve?, maintain feeds, any  role rifampin as lack of porgress ( no hardware , lack of progress), follow further with nafcillin change per ID, bmet in pm needed The patient is critically ill with multiple organ systems failure and requires high complexity decision making for assessment and support, frequent evaluation and titration of therapies, application of advanced monitoring technologies and extensive interpretation of multiple databases.   Critical Care Time devoted to patient care services described in this note is 35 Minutes. This time reflects time of care of this signee: Rory Percy, MD FACP. This critical care time does not reflect procedure time, or teaching time or supervisory time of PA/NP/Med student/Med Resident etc but could involve care discussion time. Rest per NP/medical resident whose note is outlined above and that I agree with   Mcarthur Rossetti. Tyson Alias, MD, FACP Pgr: [phone] [place] [facility] 05/29/2017 10:20 AM

## 2017-05-29 NOTE — Progress Notes (Signed)
Orthopedic Tech Progress Note Patient Details:  Yvonne GalleryJanice Marie Fields February 13, 1951 [id number]  Ortho Devices Type of Ortho Device:  (prafo boot) Ortho Device/Splint Location: bilateral Ortho Device/Splint Interventions: Application   Nikki DomCrawford, Aariyana Manz 05/29/2017, 11:33 AM

## 2017-05-29 NOTE — Progress Notes (Signed)
  [facility]    Date of Admission:  06/07/2017   Total days of antibiotics 4        Day 2 cefazolin   ID: Yvonne Fields is a 66 y.o. female with sepsis 2/2 MSSA bacteremia found to have new onset stroke at OSH transferred for further management Active Problems:   Embolic stroke (HCC)   Endocarditis   Severe sepsis with acute organ dysfunction due to methicillin susceptible Staphylococcus aureus (MSSA) (HCC)   MSSA bacteremia   Thrombocytopenia (HCC)   Leukocytosis   Anemia   Respiratory failure (HCC)    Subjective: Afebrile, but remains intubated did not tolerate more than 20 min of vent weaning trial.   Leukocytosis still elevated up to 36K  Medications:  . chlorhexidine gluconate (MEDLINE KIT)  15 mL Mouth Rinse BID  . Chlorhexidine Gluconate Cloth  6 each Topical Daily  . furosemide  40 mg Intravenous BID  . insulin aspart  0-9 Units Subcutaneous Q4H  . ipratropium-albuterol  3 mL Nebulization Q6H  . mouth rinse  15 mL Mouth Rinse QID  . metoprolol tartrate      . pantoprazole (PROTONIX) IV  40 mg Intravenous Q12H  . sodium chloride flush  3 mL Intravenous Q12H    Objective: Vital signs in last 24 hours: Temp:  [97.5 F (36.4 C)-99 F (37.2 C)] 98 F (36.7 C) (10/22 1539) Pulse Rate:  [105-136] 113 (10/22 1536) Resp:  [16-27] 16 (10/22 1536) BP: (102-172)/(50-66) 108/50 (10/22 1536) SpO2:  [94 %-98 %] 96 % (10/22 1536) FiO2 (%):  [40 %-50 %] 40 % (10/22 1536) Weight:  [126 lb 12.2 oz (57.5 kg)] 126 lb 12.2 oz (57.5 kg) (10/22 0341) Physical Exam  Constitutional:  Intubated/sedated. appears well-developed and well-nourished. No distress.  HENT: [place]/AT, PERRLA, no scleral icterus Mouth/Throat: OETT in place  Cardiovascular: Normal rate, regular rhythm and normal heart sounds. Exam reveals no gallop and no friction rub.  No murmur heard.  Pulmonary/Chest: Effort normal and breath sounds bilaterallyl rhonchi Abdominal: Soft.  Bowel sounds are normal.  exhibits no distension. There is no tenderness.  Lymphadenopathy: no cervical adenopathy. No axillary adenopathy Skin: Skin is warm and dry. No rash noted. No erythema.   Lab Results  Recent Labs  05/28/17 1130 05/29/17 0346  WBC 31.2* 36.1*  HGB 8.1* 8.4*  HCT 24.6* 25.8*  NA 139 142  K 4.9 5.2*  CL 112* 113*  CO2 23 23  BUN 37* 36*  CREATININE 1.10* 0.91   Liver Panel  Recent Labs  05/27/17 0425 05/28/17 0450  PROT 6.5 5.7*  ALBUMIN 1.6* 1.5*  AST 53* 99*  ALT 25 30  ALKPHOS 145* 134*  BILITOT 1.6* 1.6*  BILIDIR 0.8*  --   IBILI 0.8  --    Microbiology: 10/19 blood cx MSSA 10/21 blood cx NGTD Studies/Results: Dg Chest Port 1 View  Result Date: 05/29/2017 CLINICAL DATA:  Hypoxia EXAM: PORTABLE CHEST 1 VIEW COMPARISON:  May 28, 2017 FINDINGS: Endotracheal tube tip is 6.2 cm above the carina. Central catheter tip is in the superior vena cava. Nasogastric tube tip and side port are below the diaphragm. No pneumothorax. There is somewhat less airspace consolidation in left mid lower lung zones compared to 1 day prior. Consolidation remains in the right base region without appreciable change. There is no new opacity evident. Heart is upper normal in size with pulmonary vascularity within normal limits. There is aortic atherosclerosis. No adenopathy. No evident   bone lesions. IMPRESSION: Tube and catheter positions as described without pneumothorax. Airspace consolidation bilaterally with partial clearing on the left and no change on the right. No new opacity. Heart upper normal in size. There is aortic atherosclerosis. Aortic Atherosclerosis (ICD10-I70.0). Electronically Signed   By: Lowella Grip III M.D.   On: 05/29/2017 07:11   Dg Chest Port 1 View  Result Date: 05/28/2017 CLINICAL DATA:  Respiratory failure EXAM: PORTABLE CHEST 1 VIEW COMPARISON:  05/27/2017 and prior radiographs FINDINGS: An endotracheal tube with tip 4.5 cm above the  carina, NG tube entering the stomach with tip off the field of view and right PICC line with tip overlying the mid SVC noted. Bilateral airspace opacities are stable to minimally decreased. There is no evidence of pneumothorax. No other significant changes identified. IMPRESSION: Stable to minimally decreased bilateral airspace opacities. Electronically Signed   By: Margarette Canada M.D.   On: 05/28/2017 07:09   Dg Abd Portable 1v  Result Date: 05/29/2017 CLINICAL DATA:  Ileus. EXAM: PORTABLE ABDOMEN - 1 VIEW COMPARISON:  Abdominal x-ray dated May 25, 2017. FINDINGS: Enteric tube in place within a distended stomach. Paucity of bowel gas. No acute osseous abnormality. IMPRESSION: Paucity of bowel gas, nonspecific. Electronically Signed   By: Titus Dubin M.D.   On: 05/29/2017 15:12     Assessment/Plan: Disseminated MSSA bacteremia/endocarditis with CNS emboli as cause of stroke = plan to switch to nafcillin 2gm IV Q 4hr for CNS penetration  Leukocytosis = differential still shows left shift. Continue to monitor. If still elevated/worsening tomorrow, recommend to start imaging to look for other sources of infection  Thrombocytopenia =likely sequelae of sepsis. Continue to monitor  Health maintenance = will check for hep [facility] Cell: [phone] Pager: [phone]  05/29/2017, 4:05 PM

## 2017-05-29 NOTE — Progress Notes (Signed)
STROKE TEAM PROGRESS NOTE   SUBJECTIVE (INTERVAL HISTORY) Sister is at the bedside.  Pt still intubated on sedation.  No significant new or change from yesterday.  On antibiotics.  ID on board.  Still has significant leukocytosis and worsening.  Kidney function normalized.  Thrombocytopenia improved some.   OBJECTIVE Temp:  [97.5 F (36.4 C)-98.9 F (37.2 C)] 98 F (36.7 C) (10/22 1539) Pulse Rate:  [105-136] 129 (10/22 1800) Cardiac Rhythm: Sinus tachycardia (10/22 1600) Resp:  [16-27] 25 (10/22 1800) BP: (102-172)/(50-66) 131/53 (10/22 1800) SpO2:  [94 %-98 %] 94 % (10/22 1800) FiO2 (%):  [40 %-50 %] 40 % (10/22 1600) Weight:  [126 lb 12.2 oz (57.5 kg)] 126 lb 12.2 oz (57.5 kg) (10/22 0341)  CBC:   Recent Labs Lab 05/28/17 0450 05/28/17 1130 05/29/17 0346  WBC 26.2* 31.2* 36.1*  NEUTROABS 22.0*  --  31.0*  HGB 7.6* 8.1* 8.4*  HCT 23.1* 24.6* 25.8*  MCV 83.4 83.4 84.9  PLT 18* 22* 28*    Basic Metabolic Panel:   Recent Labs Lab 05/28/17 0450  05/29/17 0346 05/29/17 1749  NA 140  < > 142 142  K 2.8*  < > 5.2* 4.4  CL 107  < > 113* 111  CO2 26  < > 23 23  GLUCOSE 286*  < > 165* 169*  BUN 37*  < > 36* 37*  CREATININE 1.06*  < > 0.91 0.89  CALCIUM 7.1*  < > 7.7* 7.7*  MG 1.9  --  2.0  --   PHOS 2.1*  --  1.8*  --   < > = values in this interval not displayed.  Lipid Panel:     Component Value Date/Time   CHOL 51 05/25/2017 1832   TRIG 98 05/29/2017 0346   HDL <10 (L) 05/09/2017 1832   CHOLHDL NOT CALCULATED 05/18/2017 1832   VLDL 31 05/11/2017 1832   LDLCALC NOT CALCULATED 06/03/2017 1832   HgbA1c:  Lab Results  Component Value Date   HGBA1C 5.3 05/27/2017   Urine Drug Screen: No results found for: LABOPIA, COCAINSCRNUR, LABBENZ, AMPHETMU, THCU, LABBARB  Alcohol Level No results found for: ETH  IMAGING I have personally reviewed the radiological images below and agree with the radiology interpretations.  Dg Chest Port 1  View 05/25/2017 IMPRESSION:  1. Interim placement of NG tube, its tip is below left hemidiaphragm. Endotracheal tube and right PICC line stable position.  2. Interim resolution of atelectatic changes throughout the left lung.  3. Persistent multifocal bilateral pulmonary infiltrates/edema .   MRI -small bilateral acute infarcts suggesting embolic disease.  Infarcts are seen along the right occipital horn, left parietal white matter, left posterior limb internal capsule, and right corona radiata.  Questionable tiny right posterior frontal cortex infarct.    MRA Head -negative intracranial MRA.   Carotid Doppler - bilateral ICA less than 50% stenosis.  TTE revealed a echogenicity below the mitral valve.    TEE - mitral valve 1 x 1.5cm heterogeneous density attached to the posterior mitral valve, fixed but with some mobility, ddx including vegetation, fibroelastoma or organized thrombus.     PHYSICAL EXAM  Vitals:   05/29/17 1539 05/29/17 1600 05/29/17 1700 05/29/17 1800  BP:  (!) 114/50 (!) 152/54 (!) 131/53  Pulse:  (!) 111 (!) 123 (!) 129  Resp:  18 (!) 23 (!) 25  Temp: 98 F (36.7 C)     TempSrc: Axillary     SpO2:  94% 96% 94%  Weight:      Height:        Temp:  [97.5 F (36.4 C)-98.9 F (37.2 C)] 98 F (36.7 C) (10/22 1539) Pulse Rate:  [105-136] 129 (10/22 1800) Resp:  [16-27] 25 (10/22 1800) BP: (102-172)/(50-66) 131/53 (10/22 1800) SpO2:  [94 %-98 %] 94 % (10/22 1800) FiO2 (%):  [40 %-50 %] 40 % (10/22 1600) Weight:  [126 lb 12.2 oz (57.5 kg)] 126 lb 12.2 oz (57.5 kg) (10/22 0341)  General - intubated and sedated.   Ophthalmologic - Fundi not visualized due to noncooperation.  Cardiovascular - tachycardia.  Neuro - intubated and sedated on propofol. Brief eye opening on voice even with sedation being held briefly but not able to remain opening. Not following commands. PERRL, not tracking, doll's eye present, positive corneal and gag. On pain stimulation, LLE  mild withdraw but no significant movement on other limbs. DTR diminished and no Babinski. Sensation, coordination and gait not tested.    ASSESSMENT/PLAN [name] is a 66 y.o. female with history of hepatitis C, congestive heart failure, COPD, presenting with Right facial weakness associated with nausea, vomiting, diarrhea, recent UTI, acute renal failure, leukocytosis, and lactic acidosis. She did not receive IV t-PA due to thrombocytopenia.  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   Resultant  Intubated on sedation, lack of response  MRI head - OSH - right PCA, right MCA/ACA, left IC, left MCA/PCA punctate infarcts.    MRA head - unremarkable, no mycotic aneurysm seen  CUS - unremarkable   TTE - OSH - EF 50-55%, echogenicity below the mitral valve  TEE - OSH - left 1x1.5cm MV mass vs. Vegetation, endocarditis vs. Fibroelastoma vs. Organized thrombus.  LDL - not calculated due to low HDL  HgbA1c - 5.3  Blood culture positive for MSSA  VTE prophylaxis - SCDs Diet NPO time specified  aspirin 81 mg daily prior to admission, now on No antithrombotic. No antiplatelet indicated for endocarditis. In addition, pt has severe thrombocytopenia and anemia.   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   Vanco and zosyn -> changed to Ancef 05/28/2017  ID on board, plan to switch to nafcillin  Recommend cardiology consultation too  Leukocytosis  WBC 22.7->26.3 ->29.1 ->26.2->36.1  Vanco and zosyn -> changed to Ancef 05/28/2017  ID on  board, plan to switch to nafcillin  Fever resolved  Anemia   Hb 6.4 - s/p PRBC ->8.4 -> 7.6->8.4  Stable after transfusion  Close observation  Thrombocytopenia with elevated INR  Platelets 33->35 ->18->28  Avoid antiplatelet  INR 1.85->1.62  Ordered peripheral blood smear - target cells, no schistocytes seen   HIT antibody negative  Hypertension  Stable  Permissive hypertension (OK if < 180/105) but gradually normalize in 5-7 days  Long-term BP goal normotensive  Other Stroke Risk Factors  Advanced age  Other Active Problems  Acute kidney injury - Cre 1.31-> 1.06->0.89  Sinus tachycardia  Hypokalemia - 2.8 -> supplementation ->4.4  Hospital day # 3  This patient is critically ill due to stroke, MV vegetation, high fever, leukocytosis, thrombocytopenia, anemia and at significant risk of neurological  worsening, death form recurrent stroke, anemia, bleeding, heart failure, sepsis. This patient's care requires constant monitoring of vital signs, hemodynamics, respiratory and cardiac monitoring, review of multiple databases, neurological assessment, discussion with family, other specialists and medical decision making of high complexity. I had long discussion with daughter and sons at bedside, updated pt current condition, treatment plan and potential prognosis. They expressed understanding and appreciation. I also discussed with Dr. Vassie LollAlva in the hallway. I spent 35 minutes of neurocritical care time in the care of this patient.  Yvonne PlanJindong Jiraiya Mcewan, MD PhD Stroke Neurology 05/29/2017 6:55 PM   To contact Stroke Continuity provider, please refer to [email or web address]. After hours, contact General Neurology

## 2017-05-30 ENCOUNTER — Inpatient Hospital Stay (HOSPITAL_COMMUNITY): Payer: Medicaid Other

## 2017-05-30 LAB — BLOOD GAS, ARTERIAL
Acid-base deficit: 2.1 mmol/L — ABNORMAL HIGH (ref 0.0–2.0)
BICARBONATE: 22.7 mmol/L (ref 20.0–28.0)
Drawn by: 51702
FIO2: 40
LHR: 18 {breaths}/min
MECHVT: 500 mL
O2 Saturation: 97 %
PEEP: 10 cmH2O
PO2 ART: 101 mmHg (ref 83.0–108.0)
Patient temperature: 98.6
pCO2 arterial: 42.1 mmHg (ref 32.0–48.0)
pH, Arterial: 7.351 (ref 7.350–7.450)

## 2017-05-30 LAB — CBC WITH DIFFERENTIAL/PLATELET
BASOS ABS: 0 10*3/uL (ref 0.0–0.1)
Basophils Relative: 0 %
Eosinophils Absolute: 0.4 10*3/uL (ref 0.0–0.7)
Eosinophils Relative: 1 %
HEMATOCRIT: 26 % — AB (ref 36.0–46.0)
HEMOGLOBIN: 8.5 g/dL — AB (ref 12.0–15.0)
LYMPHS PCT: 11 %
Lymphs Abs: 4.5 10*3/uL — ABNORMAL HIGH (ref 0.7–4.0)
MCH: 28.1 pg (ref 26.0–34.0)
MCHC: 32.7 g/dL (ref 30.0–36.0)
MCV: 85.8 fL (ref 78.0–100.0)
MONOS PCT: 5 %
Monocytes Absolute: 2 10*3/uL — ABNORMAL HIGH (ref 0.1–1.0)
NEUTROS ABS: 33.8 10*3/uL — AB (ref 1.7–7.7)
NEUTROS PCT: 83 %
Platelets: 67 10*3/uL — ABNORMAL LOW (ref 150–400)
RBC: 3.03 MIL/uL — ABNORMAL LOW (ref 3.87–5.11)
RDW: 27.3 % — AB (ref 11.5–15.5)
WBC: 40.7 10*3/uL — AB (ref 4.0–10.5)

## 2017-05-30 LAB — GLUCOSE, CAPILLARY
GLUCOSE-CAPILLARY: 140 mg/dL — AB (ref 65–99)
Glucose-Capillary: 128 mg/dL — ABNORMAL HIGH (ref 65–99)
Glucose-Capillary: 130 mg/dL — ABNORMAL HIGH (ref 65–99)
Glucose-Capillary: 143 mg/dL — ABNORMAL HIGH (ref 65–99)
Glucose-Capillary: 150 mg/dL — ABNORMAL HIGH (ref 65–99)
Glucose-Capillary: 153 mg/dL — ABNORMAL HIGH (ref 65–99)

## 2017-05-30 LAB — PHOSPHORUS: Phosphorus: 6.1 mg/dL — ABNORMAL HIGH (ref 2.5–4.6)

## 2017-05-30 LAB — HEPATITIS C GENOTYPE

## 2017-05-30 LAB — HEPATITIS C VRS RNA DETECT BY PCR-QUAL: HEPATITIS C VRS RNA BY PCR-QUAL: POSITIVE — AB

## 2017-05-30 LAB — BASIC METABOLIC PANEL
ANION GAP: 10 (ref 5–15)
BUN: 50 mg/dL — ABNORMAL HIGH (ref 6–20)
CO2: 21 mmol/L — ABNORMAL LOW (ref 22–32)
Calcium: 7.3 mg/dL — ABNORMAL LOW (ref 8.9–10.3)
Chloride: 111 mmol/L (ref 101–111)
Creatinine, Ser: 1.22 mg/dL — ABNORMAL HIGH (ref 0.44–1.00)
GFR calc Af Amer: 53 mL/min — ABNORMAL LOW (ref >60)
GFR, EST NON AFRICAN AMERICAN: 46 mL/min — AB (ref >60)
GLUCOSE: 165 mg/dL — AB (ref 65–99)
POTASSIUM: 4.3 mmol/L (ref 3.5–5.1)
Sodium: 142 mmol/L (ref 135–145)

## 2017-05-30 LAB — PROTIME-INR
INR: 1.46
PROTHROMBIN TIME: 17.6 s — AB (ref 11.4–15.2)

## 2017-05-30 LAB — MAGNESIUM: Magnesium: 1.7 mg/dL (ref 1.7–2.4)

## 2017-05-30 MED ORDER — IOPAMIDOL (ISOVUE-300) INJECTION 61%
INTRAVENOUS | Status: AC
  Filled 2017-05-30: qty 30

## 2017-05-30 MED ORDER — METOPROLOL TARTRATE 5 MG/5ML IV SOLN
2.5000 mg | Freq: Four times a day (QID) | INTRAVENOUS | Status: DC | PRN
  Administered 2017-05-30 (×2): 5 mg via INTRAVENOUS
  Filled 2017-05-30: qty 5

## 2017-05-30 MED ORDER — FENTANYL CITRATE (PF) 100 MCG/2ML IJ SOLN: 50.0000 ug | INTRAMUSCULAR | Status: DC | PRN

## 2017-05-30 MED ORDER — MAGNESIUM SULFATE 2 GM/50ML IV SOLN
2.0000 g | Freq: Once | INTRAVENOUS | Status: AC
  Administered 2017-05-30: 2 g via INTRAVENOUS
  Filled 2017-05-30: qty 50

## 2017-05-30 MED ORDER — IOPAMIDOL (ISOVUE-300) INJECTION 61%
INTRAVENOUS | Status: AC
  Administered 2017-05-30: 80 mL
  Filled 2017-05-30: qty 100

## 2017-05-30 MED ORDER — METOPROLOL TARTRATE 5 MG/5ML IV SOLN
INTRAVENOUS | Status: AC
  Administered 2017-05-30: 5 mg via INTRAVENOUS
  Filled 2017-05-30: qty 5

## 2017-05-30 NOTE — Progress Notes (Signed)
eLink Physician-Brief Progress Note Patient Name: Yvonne GalleryJanice Marie Fields DOB: October 01, 1950 MRN: [id number]   Date of Service  05/30/2017  HPI/Events of Note  Persistent sinus tach 130-140 range  eICU Interventions  Lopressor prn q 6h     Intervention Category Intermediate Interventions: Arrhythmia - evaluation and management  Yvonne Fields V. 05/30/2017, 4:30 AM

## 2017-05-30 NOTE — Progress Notes (Signed)
STROKE TEAM PROGRESS NOTE   SUBJECTIVE (INTERVAL HISTORY) Sister is at the bedside.  Pt still intubated on sedation. No response this am even with sedation off as per sister. Worsening leukocytosis, now pending further imaging of pan-CT. Low grade fever and worsening leukocytosis, will do venous doppler to rule out DVT.    OBJECTIVE Temp:  [98 F (36.7 C)-100.7 F (38.2 C)] 100.7 F (38.2 C) (10/23 1133) Pulse Rate:  [110-140] 123 (10/23 1138) Cardiac Rhythm: Sinus tachycardia (10/23 0800) Resp:  [14-26] 22 (10/23 1138) BP: (83-172)/(44-66) 107/55 (10/23 1138) SpO2:  [91 %-98 %] 96 % (10/23 1138) FiO2 (%):  [40 %] 40 % (10/23 1138) Weight:  [129 lb 3 oz (58.6 kg)] 129 lb 3 oz (58.6 kg) (10/23 0500)  CBC:   Recent Labs Lab 05/29/17 0346 05/30/17 0356  WBC 36.1* 40.7*  NEUTROABS 31.0* 33.8*  HGB 8.4* 8.5*  HCT 25.8* 26.0*  MCV 84.9 85.8  PLT 28* 67*    Basic Metabolic Panel:   Recent Labs Lab 05/29/17 0346 05/29/17 1749 05/30/17 0356  NA 142 142 142  K 5.2* 4.4 4.3  CL 113* 111 111  CO2 23 23 21*  GLUCOSE 165* 169* 165*  BUN 36* 37* 50*  CREATININE 0.91 0.89 1.22*  CALCIUM 7.7* 7.7* 7.3*  MG 2.0  --  1.7  PHOS 1.8*  --  6.1*    Lipid Panel:     Component Value Date/Time   CHOL 51 04/09/17 1832   TRIG 98 05/29/2017 0346   HDL <10 (L) 04/09/17 1832   CHOLHDL NOT CALCULATED 04/09/17 1832   VLDL 31 04/09/17 1832   LDLCALC NOT CALCULATED 04/09/17 1832   HgbA1c:  Lab Results  Component Value Date   HGBA1C 5.3 05/27/2017   Urine Drug Screen: No results found for: LABOPIA, COCAINSCRNUR, LABBENZ, AMPHETMU, THCU, LABBARB  Alcohol Level No results found for: ETH  IMAGING I have personally reviewed the radiological images below and agree with the radiology interpretations.  Dg Chest Port 1 View 04/09/17 IMPRESSION:  1. Interim placement of NG tube, its tip is below left hemidiaphragm. Endotracheal tube and right PICC line stable position.  2.  Interim resolution of atelectatic changes throughout the left lung.  3. Persistent multifocal bilateral pulmonary infiltrates/edema .   MRI -small bilateral acute infarcts suggesting embolic disease.  Infarcts are seen along the right occipital horn, left parietal white matter, left posterior limb internal capsule, and right corona radiata.  Questionable tiny right posterior frontal cortex infarct.    MRA Head -negative intracranial MRA.   Carotid Doppler - bilateral ICA less than 50% stenosis.  TTE revealed a echogenicity below the mitral valve.    TEE - mitral valve 1 x 1.5cm heterogeneous density attached to the posterior mitral valve, fixed but with some mobility, ddx including vegetation, fibroelastoma or organized thrombus.   Pan CT - pending  Venous doppler UEs and LEs - pending    PHYSICAL EXAM  Vitals:   05/30/17 0900 05/30/17 1000 05/30/17 1133 05/30/17 1138  BP: (!) 98/44 (!) 105/53  (!) 107/55  Pulse: (!) 124 (!) 123  (!) 123  Resp: (!) 21 (!) 23  (!) 22  Temp:   (!) 100.7 F (38.2 C)   TempSrc:   Axillary   SpO2: 96% 94%  96%  Weight:      Height:        Temp:  [98 F (36.7 C)-100.7 F (38.2 C)] 100.7 F (38.2 C) (10/23 1133) Pulse Rate:  [  110-140] 123 (10/23 1138) Resp:  [14-26] 22 (10/23 1138) BP: (83-172)/(44-66) 107/55 (10/23 1138) SpO2:  [91 %-98 %] 96 % (10/23 1138) FiO2 (%):  [40 %] 40 % (10/23 1138) Weight:  [129 lb 3 oz (58.6 kg)] 129 lb 3 oz (58.6 kg) (10/23 0500)  General - intubated and sedated.   Ophthalmologic - Fundi not visualized due to noncooperation.  Cardiovascular - tachycardia.  Neuro - intubated and sedated on propofol. Not open eyes on voice. Not following commands. PERRL, not tracking, doll's eye present, positive corneal and gag. On pain stimulation, LLE slight withdraw but no significant movement on other limbs. DTR diminished and no Babinski. Sensation, coordination and gait not tested.    ASSESSMENT/PLAN Yvonne Fields is a 66 y.o. female with history of hepatitis C, congestive heart failure, COPD, presenting with Right facial weakness associated with nausea, vomiting, diarrhea, recent UTI, acute renal failure, leukocytosis, and lactic acidosis. She did not receive IV t-PA due to thrombocytopenia.  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   Resultant  Intubated on sedation, lack of response  MRI head - OSH - right PCA, right MCA/ACA, left IC, left MCA/PCA punctate infarcts.    MRA head - unremarkable, no mycotic aneurysm seen  CUS - unremarkable   TTE - OSH - EF 50-55%, echogenicity below the mitral valve  TEE - OSH - left 1x1.5cm MV mass vs. Vegetation, endocarditis vs. Fibroelastoma vs. Organized thrombus.  LDL - not calculated due to low HDL  HgbA1c - 5.3  Blood culture positive for MSSA  VTE prophylaxis - SCDs Diet NPO time specified  aspirin 81 mg daily prior to admission, now on No antithrombotic. No antiplatelet indicated for endocarditis. In addition, pt has severe thrombocytopenia and anemia.   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   Vanco and zosyn -> Ancef 05/28/2017 -> nafcillin 05/30/17  Recommend cardiology consultation too  Leukocytosis, worsening  WBC 22.7->26.3 ->29.1 ->26.2->36.1->40.7  Vanco and zosyn -> Ancef -> nafcillin  ID on board  Low grade fever  Pan CT pending  LE/UE venous doppler - pending  Anemia   Hb 6.4 - s/p PRBC ->8.4 -> 7.6->8.4->8.5  Stable after transfusion  Close observation  Thrombocytopenia  with elevated INR, improving  Platelets 33->35 ->18->28->67  Avoid antiplatelet  INR 1.85->1.62  Ordered peripheral blood smear - no schistocytes seen   HIT antibody negative  Hypertension  Stable BP goal normotensive  Other Stroke Risk Factors  Advanced age  Other Active Problems  Acute kidney injury - Cre 1.31-> 1.06->0.89->1.22  Sinus tachycardia  Hypokalemia, resolved  Hospital day # 4  This patient is critically ill due to stroke, MV vegetation, high fever, leukocytosis, thrombocytopenia, anemia and at significant risk of neurological worsening, death form recurrent stroke, anemia, bleeding, heart failure, sepsis. This patient's care requires constant monitoring of vital signs, hemodynamics, respiratory and cardiac monitoring, review of multiple databases, neurological assessment, discussion with family, other specialists and medical decision making of high complexity. I had long discussion with daughter and sons at bedside, updated pt current condition, treatment plan and potential prognosis. They expressed understanding and appreciation. I  also discussed with Dr. Vassie Loll in the hallway. I spent 35 minutes of neurocritical care time in the care of this patient.  Marvel Plan, MD PhD Stroke Neurology 05/30/2017 11:44 AM   To contact Stroke Continuity provider, please refer to [email or web address]. After hours, contact General Neurology

## 2017-05-30 NOTE — Progress Notes (Addendum)
PULMONARY / CRITICAL CARE MEDICINE   Name: Yvonne Fields MRN: [id number] DOB: [birth year]    ADMISSION DATE:  Jun 15, 2017 CONSULTATION DATE:  2017-06-15  REFERRING MD:  Dr. Francia Greaves hospitalist  CHIEF COMPLAINT:  Embolic stroke  HISTORY OF PRESENT ILLNESS:   66 year old female with past medical history significant for COPD, GERD, hepatitis C, congestive heart failure, and recent urinary tract infection. Also pubic rami fracture diagnosed September of this year. She presented to [facility] on 10/17 with complaints of nausea, vomiting, diarrhea, and abdominal pain 10 days. She was admitted for dehydration and associated acute kidney injury. The following day she awoke from sleep with right-sided facial droop and associated weakness. Code stroke was activated, it was felt as though she had suffered an ischemic event. She was not given TPA due to unknown time of last no well and INR greater than 2. MRI of the brain revealed multiple areas of infarction consistent with embolic disease. 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 After the TEE was hypoxemic requiring intubation.Chest x-ray at that time demonstrated left lung collapse. She underwent a bronchoscopy during which copious secretions were removed including some plugs. She was transferred to [facility] for surgical consultation.   SUBJECTIVE:  No acute events.  Weaned at PS 5/5 for several hours yesterday 10/22. Attempting to wean this AM but has tachypnea.   VITAL SIGNS: BP (!) 116/50   Pulse (!) 125   Temp (!) 100.5 F (38.1 C) (Axillary) Comment: RN Linda aware   Resp (!) 23   Ht 5\' 3"  (1.6 m)   Wt 58.6 kg (129 lb 3 oz)   SpO2 97%   BMI 22.88 kg/m   HEMODYNAMICS:    VENTILATOR SETTINGS: Vent Mode: PRVC FiO2 (%):  [40 %] 40 % Set Rate:  [18 bmp] 18 bmp Vt Set:  [500 mL] 500 mL PEEP:  [10 cmH20] 10 cmH20 Plateau Pressure:  [17 cmH20-24  cmH20] 20 cmH20  INTAKE / OUTPUT: I/O last 3 completed shifts: In: 2967.2 [I.V.:575.2; NG/GT:1800; IV Piggyback:592] Out: 2230 [Urine:2080; Stool:150]  PHYSICAL EXAMINATION: General:  Frail adult-elderly female in NAD Neuro:  RASS -3.  Not following commands. Withdraws from pain. HEENT:  [place]/AT, PERRL, dried blood in oral cavity. OG tube, TF infusing.  ETT in place. Cardiovascular: S1, S2,  RRR, no MRG. No edema.  Lungs:  Coa, bilateral breath sounds, diminished bilateral bases. Abdomen:  Soft, non-tender, non-distended. Musculoskeletal:  No acute deformity, no edema. Skin: warm, dry and intact, no lesions or rash.  LABS:  BMET  Recent Labs Lab 05/29/17 0346 05/29/17 1749 05/30/17 0356  NA 142 142 142  K 5.2* 4.4 4.3  CL 113* 111 111  CO2 23 23 21*  BUN 36* 37* 50*  CREATININE 0.91 0.89 1.22*  GLUCOSE 165* 169* 165*    Electrolytes  Recent Labs Lab 05/28/17 0450  05/29/17 0346 05/29/17 1749 05/30/17 0356  CALCIUM 7.1*  < > 7.7* 7.7* 7.3*  MG 1.9  --  2.0  --  1.7  PHOS 2.1*  --  1.8*  --  6.1*  < > = values in this interval not displayed.  CBC  Recent Labs Lab 05/28/17 1130 05/29/17 0346 05/30/17 0356  WBC 31.2* 36.1* 40.7*  HGB 8.1* 8.4* 8.5*  HCT 24.6* 25.8* 26.0*  PLT 22* 28* 67*    Coag's  Recent Labs Lab 05/28/17 0830 05/29/17 0346 05/30/17 0356  INR 1.62 1.49 1.46  Sepsis Markers  Recent Labs Lab 05/25/2017 1629 05/17/2017 1716 05/11/2017 1947  LATICACIDVEN  --  0.9 0.8  PROCALCITON 11.49  --   --     ABG  Recent Labs Lab 05/13/2017 1745 05/27/17 0822 05/30/17 0542  PHART 7.278* 7.402 7.351  PCO2ART 31.3* 28.8* 42.1  PO2ART 88.7 64.8* 101    Liver Enzymes  Recent Labs Lab 05/25/2017 1629 05/27/17 0425 05/28/17 0450  AST 48* 53* 99*  ALT 25 25 30   ALKPHOS 129* 145* 134*  BILITOT 1.1 1.6* 1.6*  ALBUMIN 1.5* 1.6* 1.5*    Cardiac Enzymes No results for input(s): TROPONINI, PROBNP in the last 168  hours.  Glucose  Recent Labs Lab 05/29/17 1144 05/29/17 1538 05/29/17 2002 05/30/17 0026 05/30/17 0345 05/30/17 0807  GLUCAP 153* 149* 133* 153* 140* 150*    Imaging Dg Chest Port 1 View  Result Date: 05/30/2017 CLINICAL DATA:  Respiratory failure. EXAM: PORTABLE CHEST 1 VIEW COMPARISON:  05/29/2017. FINDINGS: Endotracheal tube, NG tube, right PICC line stable position. Heart size normal. Low lung volumes. Persistent but improving bilateral pulmonary infiltrates/edema. No pleural effusion or pneumothorax. IMPRESSION: 1. Lines and tubes in stable position. 2. Low lung volumes. Persistent but improving bilateral pulmonary infiltrates/ edema. Electronically Signed   By: Maisie Fus  Register   On: 05/30/2017 06:45   Dg Abd Portable 1v  Result Date: 05/29/2017 CLINICAL DATA:  Ileus. EXAM: PORTABLE ABDOMEN - 1 VIEW COMPARISON:  Abdominal x-ray dated May 25, 2017. FINDINGS: Enteric tube in place within a distended stomach. Paucity of bowel gas. No acute osseous abnormality. IMPRESSION: Paucity of bowel gas, nonspecific. Electronically Signed   By: Obie Dredge M.D.   On: 05/29/2017 15:12     STUDIES:  MRI Brain 10/18 > multiple areas bilateral infarct consistent with embolic disease Carotid ultrasound > unremarkable Echo 10/18 > heterogeneous density on mitral valve suggestive of vegetation, myxoma, fibroelastoma, or organized thrombus.  TEE 10/19 > LVEF 50-55%, mild to mod mitral regurg unable to rule out veg.  DG Chest 05/27/2017>>Decreased lung volumes with slightly increased bilateral airspace Opacities. CXR 05/28/2017: Stable to minimally decreased bilateral airspace opacities. CT A / P 10/23 >  CT chest 10/23 >   CULTURES: Blood 10/18 >GS: Aerobic gram + cocci in clusters ( Drawn at [place]) Blood  10/19> Staph Aureus Blood 10/21 >  Sputum 10/19 > GS: ABUNDANT WBC PRESENT,BOTH PMN AND MONONUCLEAR  RARE GRAM POSITIVE COCCI IN PAIRS  RARE BUDDING YEAST SEEN 10/18: + POC  MSSA ( [place])  Cultures at [place] prior to transfer:  Urine c/s 10/17 negative Bronch c/s 19th - pending  ANTIBIOTICS: Zosyn 10/19 > 10/21 Vancomycin 10/19 > 10/21  Ancef 10/21 > 10/22 Nafcillin 10/22 >   SIGNIFICANT EVENTS: 10/17 admit for dehydration AKI 10/18 embolic CVA 10/19 ETT, intubated for hypoxia, and transfer to [facility] for mitral valve mass.   LINES/TUBES: ETT 10/19 > PICC 10/19 >  DISCUSSION: 66 year old female admitted for dehydration 6/17 and acute renal failure and unfortunately suffered CVA the following day. MRI demonstrates embolic disease. 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  2/2 fever Total Ch[place] < 50 [place] P:  ID following Monitor hemodynamics Case discussed with CVTS - not a candidate for surgery due to poor medical condition Might need addition lasix Might need cards consult, ? Repeat TEE  RENAL A:   Acute kidney injury Hypomag - resolved Hypokalemia - resolved NAGMA - resolved  Hypophosphatemia P:   Replete electrolytes as needed Follow BMP/ CMET daily Monitor urine output Avoid nephrotoxic medications  GASTROINTESTINAL A:   Nausea/vomiting/diarrhea x 10 days. ? If embolic disease to gut as well Cirrhosis (radiographic evidence) with known hepatitis C P:   Tube  Feeds as tolerated PPI: Protonix Assess CT abd / pelvis given worsening leukocytosis with left shift and mild fever  HEMATOLOGIC A:   Anemia - transfused 1u PRBC 10/20 Thrombocytopenia - Acute on chronic.  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 ? Septic emboli Acute metabolic encephalopathy in setting of , CVA P:   RASS goal: -1 to -2 Propofol infusion with PRN fentanyl Defer to neuro for further brain imaging Neuro following  FAMILY  - Updates: Son at bedside 10/21: Dr. Vassie LollAlva updated.  Sister updated at bedside 10/23.  - Inter-disciplinary family meet or Palliative Care meeting due by:  10/26   CC time: 30 min.   Rutherford Guysahul Desai, [place]onie Dickens- [facility] Pager: [phone]  or [phone] 05/30/2017, 8:31 AM    STAFF NOTE: I, Rory Percyaniel Feinstein, MD FACP have personally reviewed patient's available data, including medical history, events of note, physical examination and test results as part of my evaluation. I have discussed with resident/NP and other care providers such as pharmacist, RN and RRT. 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, pcxr which I reviewed shows improved int changes, edema, ett high ( will adjust down), abg reviewed keep same MV,  ps weaning if able, Worsening ATN likely, limit lasix, dc lasix, plat on rise but concern is wbc and fevers, HIGH risk abscess source control issue with staph mssa, for CT,  Lung remains open and improved overall, feeding, WBC likely is hemoconcentration from lasix however, see all cell lines, WUA in full needed, I updated famil in full The patient is critically ill with multiple organ systems failure and requires high complexity decision making for assessment and support, frequent evaluation and titration of therapies, application of advanced monitoring technologies and extensive interpretation of multiple databases.   Critical Care Time devoted to patient care services described in this note is 35 Minutes. This time reflects time of care of this signee: Rory Percyaniel Feinstein, MD FACP. This critical care time does not reflect procedure time, or  teaching time or supervisory time of PA/NP/Med student/Med Resident etc but could involve care discussion time. Rest per NP/medical resident whose note is outlined above and that I agree with   Mcarthur Rossetti. Tyson Alias, MD, FACP Pgr: [phone] [place] [facility] 05/30/2017 11:25 AM  I have had extensive discussions with family. We discussed patients current circumstances and organ failures. We also discussed patient's prior wishes under circumstances such as this. Family has decided to NOT perform resuscitation if arrest but to continue current medical support for now. And NO trach peg option

## 2017-05-31 ENCOUNTER — Inpatient Hospital Stay (HOSPITAL_COMMUNITY): Payer: Medicaid Other

## 2017-05-31 DIAGNOSIS — I639 Cerebral infarction, unspecified: Secondary | ICD-10-CM

## 2017-05-31 DIAGNOSIS — I669 Occlusion and stenosis of unspecified cerebral artery: Secondary | ICD-10-CM

## 2017-05-31 DIAGNOSIS — R7881 Bacteremia: Secondary | ICD-10-CM

## 2017-05-31 DIAGNOSIS — Z9911 Dependence on respirator [ventilator] status: Secondary | ICD-10-CM

## 2017-05-31 DIAGNOSIS — R609 Edema, unspecified: Secondary | ICD-10-CM

## 2017-05-31 DIAGNOSIS — I76 Septic arterial embolism: Secondary | ICD-10-CM

## 2017-05-31 DIAGNOSIS — Z515 Encounter for palliative care: Secondary | ICD-10-CM

## 2017-05-31 LAB — CBC WITH DIFFERENTIAL/PLATELET
BASOS PCT: 1 %
Basophils Absolute: 0.5 10*3/uL — ABNORMAL HIGH (ref 0.0–0.1)
EOS ABS: 0 10*3/uL (ref 0.0–0.7)
Eosinophils Relative: 0 %
HCT: 24.3 % — ABNORMAL LOW (ref 36.0–46.0)
Hemoglobin: 8.1 g/dL — ABNORMAL LOW (ref 12.0–15.0)
Lymphocytes Relative: 7 %
Lymphs Abs: 3.6 10*3/uL (ref 0.7–4.0)
MCH: 28.3 pg (ref 26.0–34.0)
MCHC: 33.3 g/dL (ref 30.0–36.0)
MCV: 85 fL (ref 78.0–100.0)
MONO ABS: 3.1 10*3/uL — AB (ref 0.1–1.0)
Monocytes Relative: 6 %
Neutro Abs: 44.1 10*3/uL — ABNORMAL HIGH (ref 1.7–7.7)
Neutrophils Relative %: 86 %
PLATELETS: 100 10*3/uL — AB (ref 150–400)
RBC: 2.86 MIL/uL — ABNORMAL LOW (ref 3.87–5.11)
RDW: 27.4 % — AB (ref 11.5–15.5)
WBC: 51.3 10*3/uL (ref 4.0–10.5)

## 2017-05-31 LAB — BLOOD GAS, ARTERIAL
ACID-BASE DEFICIT: 5.2 mmol/L — AB (ref 0.0–2.0)
Bicarbonate: 18.8 mmol/L — ABNORMAL LOW (ref 20.0–28.0)
DRAWN BY: 51702
FIO2: 100
O2 SAT: 97.5 %
PATIENT TEMPERATURE: 98.6
PO2 ART: 114 mmHg — AB (ref 83.0–108.0)
pCO2 arterial: 31.5 mmHg — ABNORMAL LOW (ref 32.0–48.0)
pH, Arterial: 7.393 (ref 7.350–7.450)

## 2017-05-31 LAB — COMPREHENSIVE METABOLIC PANEL
ALBUMIN: 1.4 g/dL — AB (ref 3.5–5.0)
ALK PHOS: 142 U/L — AB (ref 38–126)
ALT: 6 U/L — AB (ref 14–54)
ANION GAP: 15 (ref 5–15)
AST: 74 U/L — ABNORMAL HIGH (ref 15–41)
BILIRUBIN TOTAL: 7.9 mg/dL — AB (ref 0.3–1.2)
BUN: 78 mg/dL — ABNORMAL HIGH (ref 6–20)
CALCIUM: 6.8 mg/dL — AB (ref 8.9–10.3)
CO2: 19 mmol/L — ABNORMAL LOW (ref 22–32)
Chloride: 105 mmol/L (ref 101–111)
Creatinine, Ser: 2.02 mg/dL — ABNORMAL HIGH (ref 0.44–1.00)
GFR calc non Af Amer: 25 mL/min — ABNORMAL LOW (ref >60)
GFR, EST AFRICAN AMERICAN: 29 mL/min — AB (ref >60)
GLUCOSE: 153 mg/dL — AB (ref 65–99)
Potassium: 4.7 mmol/L (ref 3.5–5.1)
Sodium: 139 mmol/L (ref 135–145)
TOTAL PROTEIN: 6.8 g/dL (ref 6.5–8.1)

## 2017-05-31 LAB — GLUCOSE, CAPILLARY
GLUCOSE-CAPILLARY: 116 mg/dL — AB (ref 65–99)
GLUCOSE-CAPILLARY: 124 mg/dL — AB (ref 65–99)
GLUCOSE-CAPILLARY: 147 mg/dL — AB (ref 65–99)
GLUCOSE-CAPILLARY: 150 mg/dL — AB (ref 65–99)
Glucose-Capillary: 129 mg/dL — ABNORMAL HIGH (ref 65–99)
Glucose-Capillary: 116 mg/dL — ABNORMAL HIGH (ref 65–99)

## 2017-05-31 LAB — PATHOLOGIST SMEAR REVIEW

## 2017-05-31 LAB — PHOSPHORUS: Phosphorus: 7.9 mg/dL — ABNORMAL HIGH (ref 2.5–4.6)

## 2017-05-31 LAB — MAGNESIUM: Magnesium: 2.3 mg/dL (ref 1.7–2.4)

## 2017-05-31 MED ORDER — HALOPERIDOL 1 MG PO TABS: 0.5000 mg | ORAL_TABLET | ORAL | Status: DC | PRN

## 2017-05-31 MED ORDER — PANTOPRAZOLE SODIUM 40 MG IV SOLR: 40.0000 mg | INTRAVENOUS | Status: DC

## 2017-05-31 MED ORDER — LORAZEPAM 2 MG/ML PO CONC: 1.0000 mg | ORAL | Status: DC | PRN

## 2017-05-31 MED ORDER — HALOPERIDOL LACTATE 2 MG/ML PO CONC
0.5000 mg | ORAL | Status: DC | PRN
  Filled 2017-05-31: qty 0.3

## 2017-05-31 MED ORDER — LORAZEPAM 1 MG PO TABS: 1.0000 mg | ORAL_TABLET | ORAL | Status: DC | PRN

## 2017-05-31 MED ORDER — LORAZEPAM 2 MG/ML IJ SOLN: 1.0000 mg | INTRAMUSCULAR | Status: DC | PRN

## 2017-05-31 MED ORDER — BIOTENE DRY MOUTH MT LIQD: 15.0000 mL | OROMUCOSAL | Status: DC | PRN

## 2017-05-31 MED ORDER — GLYCOPYRROLATE 0.2 MG/ML IJ SOLN: 0.2000 mg | INTRAMUSCULAR | Status: DC | PRN

## 2017-05-31 MED ORDER — ORAL CARE MOUTH RINSE
15.0000 mL | Freq: Two times a day (BID) | OROMUCOSAL | Status: DC
  Administered 2017-05-31 (×2): 15 mL via OROMUCOSAL

## 2017-05-31 MED ORDER — SODIUM CHLORIDE 0.9 % IV SOLN
2.0000 mg/h | INTRAVENOUS | Status: DC
  Administered 2017-05-31: 2 mg/h via INTRAVENOUS
  Filled 2017-05-31: qty 10

## 2017-05-31 MED ORDER — SODIUM CHLORIDE 0.9% FLUSH: 3.0000 mL | INTRAVENOUS | Status: DC | PRN

## 2017-05-31 MED ORDER — SODIUM CHLORIDE 0.9% FLUSH: 3.0000 mL | Freq: Two times a day (BID) | INTRAVENOUS | Status: DC

## 2017-05-31 MED ORDER — SODIUM CHLORIDE 0.9 % IV SOLN: 250.0000 mL | INTRAVENOUS | Status: DC | PRN

## 2017-05-31 MED ORDER — HEPARIN SODIUM (PORCINE) 5000 UNIT/ML IJ SOLN
5000.0000 [IU] | Freq: Three times a day (TID) | INTRAMUSCULAR | Status: DC
  Administered 2017-05-31: 5000 [IU] via SUBCUTANEOUS
  Filled 2017-05-31 (×2): qty 1

## 2017-05-31 MED ORDER — GLYCOPYRROLATE 1 MG PO TABS
1.0000 mg | ORAL_TABLET | ORAL | Status: DC | PRN
  Filled 2017-05-31: qty 1

## 2017-05-31 MED ORDER — MORPHINE SULFATE (PF) 2 MG/ML IV SOLN
2.0000 mg | INTRAVENOUS | Status: DC | PRN
  Administered 2017-05-31: 2 mg via INTRAVENOUS
  Filled 2017-05-31: qty 1

## 2017-05-31 MED ORDER — MORPHINE SULFATE (PF) 2 MG/ML IV SOLN
1.0000 mg | INTRAVENOUS | Status: DC | PRN
  Administered 2017-05-31 (×2): 1 mg via INTRAVENOUS
  Filled 2017-05-31 (×2): qty 1

## 2017-05-31 MED ORDER — POLYVINYL ALCOHOL 1.4 % OP SOLN
1.0000 [drp] | Freq: Four times a day (QID) | OPHTHALMIC | Status: DC | PRN
  Filled 2017-05-31: qty 15

## 2017-05-31 MED ORDER — LORAZEPAM 2 MG/ML IJ SOLN
0.5000 mg | Freq: Four times a day (QID) | INTRAMUSCULAR | Status: DC
  Administered 2017-05-31 – 2017-06-01 (×2): 0.5 mg via INTRAVENOUS
  Filled 2017-05-31 (×2): qty 1

## 2017-05-31 MED ORDER — CHLORHEXIDINE GLUCONATE 0.12 % MT SOLN
15.0000 mL | Freq: Two times a day (BID) | OROMUCOSAL | Status: DC
  Administered 2017-05-31 – 2017-06-01 (×3): 15 mL via OROMUCOSAL
  Filled 2017-05-31: qty 15

## 2017-05-31 MED ORDER — MORPHINE BOLUS VIA INFUSION
2.0000 mg | INTRAVENOUS | Status: DC | PRN
  Administered 2017-06-01 (×2): 2 mg via INTRAVENOUS
  Filled 2017-05-31: qty 2

## 2017-05-31 MED ORDER — GLYCOPYRROLATE 0.2 MG/ML IJ SOLN
0.4000 mg | Freq: Three times a day (TID) | INTRAMUSCULAR | Status: DC
Start: 2017-05-31 — End: 2017-06-01
  Administered 2017-05-31 – 2017-06-01 (×3): 0.4 mg via INTRAVENOUS
  Filled 2017-05-31 (×3): qty 2

## 2017-05-31 MED ORDER — HALOPERIDOL LACTATE 5 MG/ML IJ SOLN: 0.5000 mg | INTRAMUSCULAR | Status: DC | PRN

## 2017-05-31 MED ORDER — IPRATROPIUM-ALBUTEROL 0.5-2.5 (3) MG/3ML IN SOLN: 3.0000 mL | Freq: Four times a day (QID) | RESPIRATORY_TRACT | Status: DC | PRN

## 2017-05-31 NOTE — Progress Notes (Signed)
STROKE TEAM PROGRESS NOTE   SUBJECTIVE (INTERVAL HISTORY) No family is at the bedside. Overnight event noted, pt ET dislodged and family refused re-intubation as pt DNR/DNI. Currently pt tolerate [place] well. However, still tachycardia, and worsening leukocytosis and renal function. Fever resolved since yesterday. Pan CT and UE/LE venous doppler no significant findings. On nafiillin    OBJECTIVE Temp:  [98.3 F (36.8 C)-100.7 F (38.2 C)] 98.3 F (36.8 C) (10/24 0741) Pulse Rate:  [112-139] 118 (10/24 1000) Cardiac Rhythm: Sinus tachycardia (10/24 0800) Resp:  [19-33] 24 (10/24 1000) BP: (95-127)/(47-70) 103/58 (10/24 1000) SpO2:  [86 %-97 %] 86 % (10/24 1000) FiO2 (%):  [40 %-100 %] 100 % (10/24 0740) Weight:  [129 lb 3 oz (58.6 kg)] 129 lb 3 oz (58.6 kg) (10/24 0413)  CBC:   Recent Labs Lab 05/30/17 0356 05/31/17 0402  WBC 40.7* 51.3*  NEUTROABS 33.8* 44.1*  HGB 8.5* 8.1*  HCT 26.0* 24.3*  MCV 85.8 85.0  PLT 67* 100*    Basic Metabolic Panel:   Recent Labs Lab 05/30/17 0356 05/31/17 0402  NA 142 139  K 4.3 4.7  CL 111 105  CO2 21* 19*  GLUCOSE 165* 153*  BUN 50* 78*  CREATININE 1.22* 2.02*  CALCIUM 7.3* 6.8*  MG 1.7 2.3  PHOS 6.1* 7.9*    Lipid Panel:     Component Value Date/Time   CHOL 51 06/05/2017 1832   TRIG 98 05/29/2017 0346   HDL <10 (L) 05/16/2017 1832   CHOLHDL NOT CALCULATED 06/06/2017 1832   VLDL 31 05/31/2017 1832   LDLCALC NOT CALCULATED 05/18/2017 1832   HgbA1c:  Lab Results  Component Value Date   HGBA1C 5.3 05/27/2017   Urine Drug Screen: No results found for: LABOPIA, COCAINSCRNUR, LABBENZ, AMPHETMU, THCU, LABBARB  Alcohol Level No results found for: [facility]  IMAGING I have personally reviewed the radiological images below and agree with the radiology interpretations.  Dg Chest Port 1 View 05/12/2017 IMPRESSION:  1. Interim placement of NG tube, its tip is below left hemidiaphragm. Endotracheal tube and right PICC line stable  position.  2. Interim resolution of atelectatic changes throughout the left lung.  3. Persistent multifocal bilateral pulmonary infiltrates/edema .   MRI -small bilateral acute infarcts suggesting embolic disease.  Infarcts are seen along the right occipital horn, left parietal white matter, left posterior limb internal capsule, and right corona radiata.  Questionable tiny right posterior frontal cortex infarct.    MRA Head -negative intracranial MRA.   Carotid Doppler - bilateral ICA less than 50% stenosis.  TTE revealed a echogenicity below the mitral valve.    TEE - mitral valve 1 x 1.5cm heterogeneous density attached to the posterior mitral valve, fixed but with some mobility, ddx including vegetation, fibroelastoma or organized thrombus.   Ct Head Wo Contrast 05/30/2017 IMPRESSION: 1. 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 2. No significant new infarcts or change   Ct Chest abdomen and pelvis W Contrast 05/30/2017 IMPRESSION: Atherosclerosis of thoracic and abdominal aorta is noted without aneurysm or dissection. Endotracheal and nasogastric tubes are in grossly good position. Significantly increased airspace opacity is noted in right lung base concerning for pneumonia. Emphysematous disease is noted in both upper lobes. Findings consistent with hepatic cirrhosis with mild associated ascites.   Venous doppler UEs and LEs - no DVT    PHYSICAL EXAM  Vitals:   05/31/17 0741 05/31/17 0800 05/31/17 0900 05/31/17 1000  BP:  (!) 125/49 (!) 119/50 (!) 103/58  Pulse:  Marland Kitchen)  114 (!) 115 (!) 118  Resp:  (!) 26 (!) 25 (!) 24  Temp: 98.3 F (36.8 C)     TempSrc: Oral     SpO2:  91% 95% (!) 86%  Weight:      Height:        Temp:  [98.3 F (36.8 C)-100.7 F (38.2 C)] 98.3 F (36.8 C) (10/24 0741) Pulse Rate:  [112-139] 118 (10/24 1000) Resp:  [19-33] 24 (10/24 1000) BP: (95-127)/(47-70) 103/58 (10/24 1000) SpO2:  [86 %-97 %] 86 % (10/24 1000) FiO2 (%):  [40 %-100  %] 100 % (10/24 0740) Weight:  [129 lb 3 oz (58.6 kg)] 129 lb 3 oz (58.6 kg) (10/24 0413)  General - very lethargic, open eyes on voice but not following commands.   Ophthalmologic - Fundi not visualized due to noncooperation.  Cardiovascular - tachycardia.  Extremities - edematous BUEs  Neuro - Open eyes on voice, but not following commands. Blinking to visual threat on the left, left eye gaze preference, did not pass midline. PERRL, not tracking, doll's eye present, positive corneal and gag. On pain stimulation, pt moaning to pain but no movement of BUEs. BLE with boots, slight withdraw on toe compression, but no significant movement. DTR diminished and no Babinski. Sensation, coordination and gait not tested.    ASSESSMENT/PLAN [name] is a 66 y.o. female with history of hepatitis C, congestive heart failure, COPD, presenting with Right facial weakness associated with nausea, vomiting, diarrhea, recent UTI, acute renal failure, leukocytosis, and lactic acidosis. She did not receive IV t-PA due to thrombocytopenia.  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   Resultant  Intubated on sedation, lack of response  MRI head - OSH - right PCA, right MCA/ACA, left IC, left MCA/PCA punctate infarcts.    MRA head - unremarkable, no mycotic aneurysm seen  CUS - unremarkable   TTE - OSH - EF 50-55%, echogenicity below the mitral valve  TEE - OSH - left 1x1.5cm MV mass vs. Vegetation, endocarditis vs. Fibroelastoma vs. Organized thrombus.  LDL - not calculated due to low HDL  HgbA1c - 5.3  Blood culture positive for MSSA  VTE prophylaxis - SCDs Diet NPO time specified  aspirin 81 mg daily prior to admission, now on No antithrombotic. No antiplatelet indicated for endocarditis. In addition, pt has severe thrombocytopenia and anemia.   Ongoing aggressive stroke risk factor management  Therapy recommendations:   pending  Disposition:  Pending  Respiratory failure  ET tube dislodged and removed  DNR/DNI  Currently on [place]  CCM on board  CT chest - RLL penumonia  MV vegetation - highly concerning for endocarditis  TTE and TEE confirmed MV vegetation  Significant leukocystosis  High grade fever and AMS  Blood culture - Methicillin (oxacillin) susceptible Staphylococcus aureus (MSSA).   Vanco and zosyn -> Ancef 05/28/2017 -> nafcillin 05/30/17  Recommend cardiology consultation too  Leukocytosis, worsening  WBC 22.7->26.3 ->29.1 ->26.2->36.1->40.7->51.3  Vanco and zosyn -> Ancef -> nafcillin  ID on board  Afebrile now  Pan CT RLL peumonia  LE/UE venous doppler - no DVTs  AKI  Cre 1.31-> 1.06->0.89->1.22->2.02  CCM on board  Anemia   Hb 6.4 - s/p PRBC ->8.4 -> 7.6->8.4->8.5->8.1  Stable after transfusion  Close observation  Thrombocytopenia, improving  Platelets 33->35 ->18->28->67->100  Avoid antiplatelet  INR 1.85->1.62, likely due to hx of cirrhosis  Ordered peripheral blood smear - no schistocytes seen   HIT antibody  negative  Hypertension  Stable BP goal normotensive  Other Stroke Risk Factors  Advanced age  Other Active Problems  Sinus tachycardia  Hypokalemia, resolved  Hospital day # 5  This patient is critically ill due to stroke, MV vegetation, high fever, leukocytosis, thrombocytopenia, anemia and at significant risk of neurological worsening, death form recurrent stroke, anemia, bleeding, heart failure, sepsis. This patient's care requires constant monitoring of vital signs, hemodynamics, respiratory and cardiac monitoring, review of multiple databases, neurological assessment, discussion with family, other specialists and medical decision making of high complexity. I spent 35 minutes of neurocritical care time in the care of this patient.  Marvel PlanJindong Setsuko Robins, MD PhD Stroke Neurology 05/31/2017 10:18 AM   To contact Stroke Continuity  provider, please refer to [email or web address]. After hours, contact General Neurology

## 2017-05-31 NOTE — Progress Notes (Signed)
PULMONARY / CRITICAL CARE MEDICINE   Name: Yvonne Fields MRN: [id number] DOB: June 13, 1951    ADMISSION DATE:  05/20/2017 CONSULTATION DATE:  05/17/2017  REFERRING MD:  Dr. Francia Greaves hospitalist  CHIEF COMPLAINT:  Embolic stroke  HISTORY OF PRESENT ILLNESS:   66 year old female with past medical history significant for COPD, GERD, hepatitis C, congestive heart failure, and recent urinary tract infection. Also pubic rami fracture diagnosed September of this year. She presented to [facility] on 10/17 with complaints of nausea, vomiting, diarrhea, and abdominal pain 10 days. She was admitted for dehydration and associated acute kidney injury. The following day she awoke from sleep with right-sided facial droop and associated weakness. Code stroke was activated, it was felt as though she had suffered an ischemic event. She was not given TPA due to unknown time of last no well and INR greater than 2. MRI of the brain revealed multiple areas of infarction consistent with embolic disease. 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 After the TEE was hypoxemic requiring intubation.Chest x-ray at that time demonstrated left lung collapse. She underwent a bronchoscopy during which copious secretions were removed including some plugs. She was transferred to [facility] for surgical consultation.   SUBJECTIVE:  Had inadvertent ETT dislodgement overnight.  Daughter and son updated by Dr. Merlene Pulling - DNR / DNI status confirmed and family agree with no re-intubation.  Pan CT yesterday, right basilar PNA, mild ascites, stable white matter infarcts.   VITAL SIGNS: BP 115/70   Pulse (!) 112   Temp 98.3 F (36.8 C) (Oral)   Resp (!) 27   Ht 5\' 3"  (1.6 m)   Wt 58.6 kg (129 lb 3 oz)   SpO2 95%   BMI 22.88 kg/m   HEMODYNAMICS:    VENTILATOR SETTINGS: Vent Mode: PRVC FiO2 (%):  [40 %-100 %] 100 % Set Rate:  [18 bmp] 18  bmp Vt Set:  [500 mL] 500 mL PEEP:  [10 cmH20] 10 cmH20 Plateau Pressure:  [19 cmH20-22 cmH20] 19 cmH20  INTAKE / OUTPUT: I/O last 3 completed shifts: In: 2621.1 [I.V.:571.1; NG/GT:1200; IV Piggyback:850] Out: 1165 [Urine:1165]  PHYSICAL EXAMINATION: General:  Frail adult-elderly female in NAD Neuro:  Eyes open but not following any commands. HEENT:  [place]/AT, PERRL, NRB in place. Cardiovascular: S1, S2,  RRR, no MRG. No edema.  Lungs:  CTAB, diminished in bases. Abdomen:  Soft, non-tender, non-distended. Musculoskeletal:  No acute deformity, no edema. Skin: warm, dry and intact, no lesions or rash.  LABS:  BMET  Recent Labs Lab 05/29/17 1749 05/30/17 0356 05/31/17 0402  NA 142 142 139  K 4.4 4.3 4.7  CL 111 111 105  CO2 23 21* 19*  BUN 37* 50* 78*  CREATININE 0.89 1.22* 2.02*  GLUCOSE 169* 165* 153*    Electrolytes  Recent Labs Lab 05/29/17 0346 05/29/17 1749 05/30/17 0356 05/31/17 0402  CALCIUM 7.7* 7.7* 7.3* 6.8*  MG 2.0  --  1.7 2.3  PHOS 1.8*  --  6.1* 7.9*    CBC  Recent Labs Lab 05/29/17 0346 05/30/17 0356 05/31/17 0402  WBC 36.1* 40.7* 51.3*  HGB 8.4* 8.5* 8.1*  HCT 25.8* 26.0* 24.3*  PLT 28* 67* 100*    Coag's  Recent Labs Lab 05/28/17 0830 05/29/17 0346 05/30/17 0356  INR 1.62 1.49 1.46    Sepsis Markers  Recent Labs Lab 05/29/2017 1629 05/20/2017 1716 05/25/2017 1947  LATICACIDVEN  --  0.9 0.8  PROCALCITON 11.49  --   --     ABG  Recent Labs Lab 05/27/17 0822 05/30/17 0542 05/31/17 0109  PHART 7.402 7.351 7.393  PCO2ART 28.8* 42.1 31.5*  PO2ART 64.8* 101 114*    Liver Enzymes  Recent Labs Lab 05/27/17 0425 05/28/17 0450 05/31/17 0402  AST 53* 99* 74*  ALT 25 30 6*  ALKPHOS 145* 134* 142*  BILITOT 1.6* 1.6* 7.9*  ALBUMIN 1.6* 1.5* 1.4*    Cardiac Enzymes No results for input(s): TROPONINI, PROBNP in the last 168 hours.  Glucose  Recent Labs Lab 05/30/17 1127 05/30/17 1536 05/30/17 1933  05/31/17 0009 05/31/17 0359 05/31/17 0739  GLUCAP 143* 128* 130* 150* 147* 129*    Imaging Ct Head Wo Contrast  Result Date: 05/30/2017 CLINICAL DATA:  Altered level of consciousness. EXAM: CT HEAD WITHOUT CONTRAST TECHNIQUE: Contiguous axial images were obtained from the base of the skull through the vertex without intravenous contrast. COMPARISON:  MRI brain 05/25/2017 FINDINGS: Brain: Previously noted white matter infarcts are again seen. No new infarct is present. No significant cortical infarct is present. The basal ganglia are intact. The insular ribbon is normal bilaterally. The ventricles are of normal size. No significant extra-axial fluid collection is present. Vascular: Atherosclerotic calcifications are present in the cavernous internal carotid artery is bilaterally. There is no hyperdense vessel. Skull: The calvarium is intact. No focal lytic or blastic lesions are present. Sinuses/Orbits: The paranasal sinuses and mastoid air cells are clear. A left lens replacement is present. The globes and orbits are otherwise normal. Other: The patient is intubated. IMPRESSION: 1. 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 2. No significant new infarcts or change Electronically Signed   By: Marin Roberts M.D.   On: 05/30/2017 15:06   Ct Chest W Contrast  Result Date: 05/30/2017 CLINICAL DATA:  Leukocytosis.  Fever. EXAM: CT CHEST, ABDOMEN, AND PELVIS WITH CONTRAST TECHNIQUE: Multidetector CT imaging of the chest, abdomen and pelvis was performed following the standard protocol during bolus administration of intravenous contrast. CONTRAST:  80mL ISOVUE-300 IOPAMIDOL (ISOVUE-300) INJECTION 61% COMPARISON:  CT scan of abdomen and pelvis of April 01, 2017. FINDINGS: CT CHEST FINDINGS Cardiovascular: Endotracheal tube is in grossly good position. Atherosclerosis of thoracic aorta is noted without aneurysm or dissection. No pericardial effusion is noted. Mediastinum/Nodes: Nasogastric  tube is seen passing through esophagus into stomach. Thyroid gland is unremarkable. No mediastinal adenopathy is noted. Lungs/Pleura: Emphysematous disease is noted in the upper lobes bilaterally. Significantly increased airspace opacity is noted in right lower lobe concerning for pneumonia. No pneumothorax or pleural effusion is noted. Musculoskeletal: No chest wall mass or suspicious bone lesions identified. CT ABDOMEN PELVIS FINDINGS Hepatobiliary: Findings consistent with hepatic cirrhosis without focal abnormality. No gallstones are noted. Mild amount of fluid is noted around the liver. Pancreas: Unremarkable. No pancreatic ductal dilatation or surrounding inflammatory changes. Spleen: Normal in size without focal abnormality. Adrenals/Urinary Tract: Adrenal glands and kidneys are unremarkable. No hydronephrosis or renal obstruction is noted. Foley catheter is noted in the urinary bladder. Stomach/Bowel: Nasogastric tube tip is seen in distal stomach. There is no evidence of bowel obstruction or inflammation. Vascular/Lymphatic: Aortic atherosclerosis. No enlarged abdominal or pelvic lymph nodes. Reproductive: Uterus and bilateral adnexa are unremarkable. Other: Mild ascites is noted.  No hernia is noted. Musculoskeletal: No acute or significant osseous findings. IMPRESSION: Atherosclerosis of thoracic and abdominal aorta is noted without aneurysm or dissection. Endotracheal and nasogastric tubes are in grossly good position. Significantly increased airspace opacity is noted  in right lung base concerning for pneumonia. Emphysematous disease is noted in both upper lobes. Findings consistent with hepatic cirrhosis with mild associated ascites. Electronically Signed   By: Lupita Raider, M.D.   On: 05/30/2017 15:12   Ct Abdomen Pelvis W Contrast  Result Date: 05/30/2017 CLINICAL DATA:  Leukocytosis.  Fever. EXAM: CT CHEST, ABDOMEN, AND PELVIS WITH CONTRAST TECHNIQUE: Multidetector CT imaging of the chest,  abdomen and pelvis was performed following the standard protocol during bolus administration of intravenous contrast. CONTRAST:  80mL ISOVUE-300 IOPAMIDOL (ISOVUE-300) INJECTION 61% COMPARISON:  CT scan of abdomen and pelvis of April 01, 2017. FINDINGS: CT CHEST FINDINGS Cardiovascular: Endotracheal tube is in grossly good position. Atherosclerosis of thoracic aorta is noted without aneurysm or dissection. No pericardial effusion is noted. Mediastinum/Nodes: Nasogastric tube is seen passing through esophagus into stomach. Thyroid gland is unremarkable. No mediastinal adenopathy is noted. Lungs/Pleura: Emphysematous disease is noted in the upper lobes bilaterally. Significantly increased airspace opacity is noted in right lower lobe concerning for pneumonia. No pneumothorax or pleural effusion is noted. Musculoskeletal: No chest wall mass or suspicious bone lesions identified. CT ABDOMEN PELVIS FINDINGS Hepatobiliary: Findings consistent with hepatic cirrhosis without focal abnormality. No gallstones are noted. Mild amount of fluid is noted around the liver. Pancreas: Unremarkable. No pancreatic ductal dilatation or surrounding inflammatory changes. Spleen: Normal in size without focal abnormality. Adrenals/Urinary Tract: Adrenal glands and kidneys are unremarkable. No hydronephrosis or renal obstruction is noted. Foley catheter is noted in the urinary bladder. Stomach/Bowel: Nasogastric tube tip is seen in distal stomach. There is no evidence of bowel obstruction or inflammation. Vascular/Lymphatic: Aortic atherosclerosis. No enlarged abdominal or pelvic lymph nodes. Reproductive: Uterus and bilateral adnexa are unremarkable. Other: Mild ascites is noted.  No hernia is noted. Musculoskeletal: No acute or significant osseous findings. IMPRESSION: Atherosclerosis of thoracic and abdominal aorta is noted without aneurysm or dissection. Endotracheal and nasogastric tubes are in grossly good position. Significantly  increased airspace opacity is noted in right lung base concerning for pneumonia. Emphysematous disease is noted in both upper lobes. Findings consistent with hepatic cirrhosis with mild associated ascites. Electronically Signed   By: Lupita Raider, M.D.   On: 05/30/2017 15:12     STUDIES:  MRI Brain 10/18 > multiple areas bilateral infarct consistent with embolic disease Carotid ultrasound > unremarkable Echo 10/18 > heterogeneous density on mitral valve suggestive of vegetation, myxoma, fibroelastoma, or organized thrombus.  TEE 10/19 > LVEF 50-55%, mild to mod mitral regurg unable to rule out veg.  DG Chest 05/27/2017>>Decreased lung volumes with slightly increased bilateral airspace Opacities. CXR 05/28/2017: Stable to minimally decreased bilateral airspace opacities. CT chest, A / P 10/23 > PNA right base, emphysematous changes bilaterally, hepatic cirrhosis with mild ascites. CT head 10/23 > stable scattered white matter infarcts.  CULTURES: Blood 10/18 >GS: Aerobic gram + cocci in clusters ( Drawn at [place]) Blood  10/19> Staph Aureus Blood 10/21 >  Sputum 10/19 > GS: ABUNDANT WBC PRESENT,BOTH PMN AND MONONUCLEAR  RARE GRAM POSITIVE COCCI IN PAIRS  RARE BUDDING YEAST SEEN 10/18: + POC MSSA ( [place])  Cultures at [place] prior to transfer:  Urine c/s 10/17 negative Bronch c/s 19th - pending  ANTIBIOTICS: Zosyn 10/19 > 10/21 Vancomycin 10/19 > 10/21  Ancef 10/21 > 10/22 Nafcillin 10/22 >   SIGNIFICANT EVENTS: 10/17 admit for dehydration AKI 10/18 embolic CVA 10/19 ETT, intubated for hypoxia, and transfer to [facility] for mitral valve mass.  10/24 early AM > ETT dislodgement  10/24 transferred to med-surge  LINES/TUBES: ETT 10/19 > 10/24 PICC 10/19 >  DISCUSSION: 66 year old female admitted for dehydration 6/17 and acute renal failure and unfortunately suffered CVA the following day. MRI demonstrates embolic disease. 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  ASSESSMENT / PLAN:  PULMONARY A: Acute hypoxemic respiratory failure - likely multifactorial with possible aspiration, mucous plugs (plugging seen on bronch).  S/p inadvertent endotracheal tube dislodgement overnight 10/23. 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  2/2 fever Total Cholesterol < 50 at [place] P:  ID following Monitor hemodynamics Case discussed with CVTS - not a candidate for surgery due to poor medical condition Might need cards consult, ? Repeat TEE  RENAL A:   Acute kidney injury - worsened after IV dye load 10/23 Hypomag - resolved Hypokalemia - resolved NAGMA - resolved  P:   Replete electrolytes as needed Follow BMP/ CMET daily Monitor urine output Avoid nephrotoxic medications  GASTROINTESTINAL A:   Nausea/vomiting/diarrhea x 10 days. ? If embolic disease to gut as well Cirrhosis (radiographic evidence) with known hepatitis C P:   NPO given mental status SLP eval once more alert  HEMATOLOGIC A:   Anemia - transfused 1u PRBC 10/20 Thrombocytopenia - Acute on chronic.  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 ? Septic emboli Acute metabolic encephalopathy in setting of , CVA P:   Avoid all sedating meds Defer to neuro for further brain imaging Neuro following Consider palliative care consult given progressive decline  FAMILY  - Updates: Son at bedside 10/21: Dr. Vassie LollAlva updated.  Sister updated at bedside 10/23.  Son and daughter updated over phone overnight 10/23.  - Inter-disciplinary family meet or Palliative Care meeting due by:  10/26   Transfer to med-surge.  Full DNR / DNI.  Might need palliative care consult for end of life discussions, etc.  Will ask TRH to assume care starting AM 10/25 and PCCM off at that time.   Rutherford Guysahul Desai, GeorgiaPA Sidonie Dickens- [facility] [place] [facility] Pager: [phone]  or [phone] 05/31/2017, 8:04 AM   STAFF NOTE: I, Rory Percyaniel Feinstein, MD FACP have personally reviewed patient's available data, including medical history, events of note, physical examination and test results as part of my evaluation. I have discussed with resident/NP and other care providers such as pharmacist, RN and RRT. In addition, I personally evaluated patient and elicited key findings of: some distress, ett self removed, ronchi, JVD, some agitation, not fc, edema remains uppers, CT I reviewed does not show progression of emboli, CT I reviewed chest shows some rt base PAn concerning for necrotizing which would be c/w organism known, O2 support OKay, I  am concerned abotu pain and rr elevation and comfort needs, may need to escalate to morphine drip, add morphine, maintaining nafcillin, no source control issue noted, realizing that MRI could show more emboli that CT not seen, no role MRi now, family realistic about prognosis, abg reviewed co2 wnl, worsening renal failure atn likely, hold any diuretics, total bili a major concern, if able could use lactulose doubt family would want any further discomfort from lactulsoe, plat up 100, re add sub q  hep, WBC up, concerning for uncontrolled endocarditis, no loose stools sig  Noted, dc ppi not indicated, we should concentrate on comfort level, and dc all therapy if she declares her self as failure and distress further Ccm time 30 min    Mcarthur Rossetti. Tyson Alias, MD, FACP Pgr: [phone] [place] [facility] 05/31/2017 11:04 AM

## 2017-05-31 NOTE — Progress Notes (Signed)
UE and LE venous duplex completed.  Preliminary results can be found: Chart review -> CV Proc   Farrel DemarkJill Eunice, RDMS, RVT 05/31/2017

## 2017-05-31 NOTE — Progress Notes (Signed)
ID PROGRESS NOTE  ID: Ethlyn GalleryJanice Marie Lesh is a 66 y.o. female with sepsis 2/2 MSSA bacteremia found to have new onset stroke at OSH transferred for further management  24hr: ET tube dislodged, extubated, remains on [place],but not reintubated due to DNR/DNI.   Physical exam is unchanged eyes opening briefly but not following commands.  Labs reveal worsening leukocytosis of WBC 50K. Worsening renal function on labwork  A/P: MSSA endocarditis with CNS embolic stroke, respiratory distress and aki  - continue with renally dosed nafcillin - overall, remains critically ill, prognosis poor - agree with primary team to bring in palliative care if she continues to worsen - will sign off. Call if questions  Aram BeechamCynthia B. Drue SecondSnider MD MPH [facility] [phone]

## 2017-05-31 NOTE — Progress Notes (Addendum)
Called to bedside as ETT was dislodged. Spoke with Daughter and Son, whom verify that patient is a DNR/DNI and do not wish for reintubation.   Attending Addendum: Called emergently to bedside. ETT had dislodged. At time of my exam patient with Pox 97% on NRB. RR 24 which RT says was similar to prior to extubation. HR 120's which I am told is also where she was prior to extubation. Patient taking good deep breaths, lungs CTA b/l, no stridor. Very somnolent only withdrawing to pain but is protecting her airway. Propofol had been on prior to extubation, now turned off. Jovita KussmaulKatalina Eubanks NP called patient's family who said she is a full DNR and DNI and that we should not reintubate. Will obtain ABG now and if hypercapneic will place on BIPAP.    Milana ObeyKathleen Hammonds, MD Pulmonary & Critical Care Medicine Pager: [phone]

## 2017-05-31 NOTE — Consult Note (Signed)
Consultation Note Date: 05/31/2017   Patient Name: Yvonne Fields  DOB: [birth year]  MRN: [id number]  Age / Sex: 66 y.o., female  PCP: No primary care provider on file. Referring Physician: Raylene Miyamoto, MD  Reason for Consultation: Establishing goals of care  HPI/Patient Profile: 66 y.o. female  with past medical history of COPD, GERD, hepatitis C, CHF, recent UTI, and pubic rami fracture admitted on 05/18/2017 with N/V/D and abdominal pain x10 days. Admitted for dehydration and AKI. After admission, she had CVA - TPA not given d/t unknown time and INR >2. MRI of the brain revealed multiple areas of infarction consistent with embolic disease. 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 After the TEE, she was hypoxemic requiring intubation.Chest x-ray at that time demonstrated left lung collapse. She underwent a bronchoscopy during which copious secretions were removed including some plugs. Overnight, she self-extubated and family elected to not reintubate her. Palliative care consulted for goals of care discussion.  Clinical Assessment and Goals of Care: Yvonne Rhodes, NP and Florentina Jenny, PA met with patient's daughter and son to discuss goals of care. They have good understanding of patient's condition and poor prognosis. The clearly expressed desire to solely focus on patient's comfort. They do not want any life-prolonging measures. They express that she is a "modest and dignified" lady and would not want to prolong life in the state she is in.   Patient's daughter lives in [place] and son lives in [place]. Son and patient recently moved to [place] from [place] about a year ago. Patient was formerly a home health aid.  We discussed that patient may pass in hospital or may be eligible for residential hospice dependent on how patient responds to comfort measures. Family is  agreeable to residential hospice if patient does not pass quickly. All questions and concerns addressed. Emotional support provided.  Primary Decision Maker NEXT OF KIN - son and daughter    SUMMARY OF RECOMMENDATIONS    - Full comfort care - D/c antibiotics - Wean oxygen to comfort - Comfort feeding if alert - Move to [place] Planning:  DNR   Symptom Management:   Scheduled and PRN robinul for secretions  PRN haldol for agitation/delirium  PRN Duoneb for shortness of breath  Scheduled and PRN ativan for anxiety  Continuous morphine infusion with boluses for pain and dyspnea  Mouth care  Palliative Prophylaxis:   Delirium Protocol, Eye Care, Frequent Pain Assessment, Oral Care and Turn Reposition  Additional Recommendations (Limitations, Scope, Preferences):  Full Comfort Care, Minimize Medications, No Artificial Feeding, No Diagnostics, No Glucose Monitoring, No IV Antibiotics and No Lab Draws  Psycho-social/Spiritual:   Desire for further Chaplaincy support:no - per family patient is not spiritual  Additional Recommendations: Education on Hospice  Prognosis:   Hours - Days  Discharge Planning: Anticipated Hospital Death      Primary Diagnoses: Present on Admission: . Embolic stroke ([place])   I have reviewed the medical record, interviewed the patient and family, and examined the patient. The following aspects are pertinent.  No past medical history on file. Social History   Social History  . Marital status: Divorced    Spouse name: N/A  . Number of children: N/A  . Years of education: N/A   Social History Main Topics  . Smoking status: Not on file  . Smokeless tobacco: Not on file  . Alcohol use Not on file  . Drug  use: Unknown  . Sexual activity: Not on file   Other Topics Concern  . Not on file   Social History Narrative  . No narrative on file   No family history on file. Scheduled Meds: . chlorhexidine   15 mL Mouth Rinse BID  . Chlorhexidine Gluconate Cloth  6 each Topical Daily  . heparin subcutaneous  5,000 Units Subcutaneous Q8H  . insulin aspart  0-9 Units Subcutaneous Q4H  . ipratropium-albuterol  3 mL Nebulization Q6H  . mouth rinse  15 mL Mouth Rinse q12n4p  . [START ON Jun 03, 2017] pantoprazole (PROTONIX) IV  40 mg Intravenous Q24H  . sodium chloride flush  3 mL Intravenous Q12H   Continuous Infusions: . sodium chloride    . sodium chloride    . nafcillin IV 2 g (05/31/17 1608)   PRN Meds:.sodium chloride, sodium chloride, acetaminophen, metoprolol tartrate, morphine injection, sodium chloride flush, sodium chloride flush No Active Allergies Review of Systems  Unable to perform ROS   Physical Exam  Constitutional: She has a sickly appearance.  Altered mental status, eyes follow voice, unable to follow commands  Cardiovascular: Tachycardia present.   Pulmonary/Chest: No respiratory distress. She has decreased breath sounds.  Abdominal: Soft. Bowel sounds are decreased.  Neurological: She is disoriented.  Skin: Skin is warm. Ecchymosis noted.    Vital Signs: BP (!) 111/48   Pulse (!) 111   Temp (!) 97.5 F (36.4 C) (Axillary)   Resp 17   Ht 5' 3" (1.6 m)   Wt 58.6 kg (129 lb 3 oz)   SpO2 91%   BMI 22.88 kg/m  Pain Assessment: CPOT   Pain Score: Asleep   SpO2: SpO2: 91 % O2 Device:SpO2: 91 % O2 Flow Rate: .O2 Flow Rate (L/min): 6 L/min  IO: Intake/output summary:  Intake/Output Summary (Last 24 hours) at 05/31/17 1615 Last data filed at 05/31/17 1500  Gross per 24 hour  Intake          1220.33 ml  Output              785 ml  Net           435.33 ml    LBM: Last BM Date: 05/31/17 Baseline Weight: Weight: 52.4 kg (115 lb 8.3 oz) Most recent weight: Weight: 58.6 kg (129 lb 3 oz)     Palliative Assessment/Data: 10%     Time In: 16:00 Time Out: 17:15 Time Total: 75 minutes  Greater than 50%  of this time was spent counseling and coordinating care  related to the above assessment and plan.  Juel Burrow, DNP, AGNP-C Palliative Medicine Team [phone]

## 2017-05-31 NOTE — Progress Notes (Signed)
CRITICAL VALUE ALERT  Critical Value:  WBC 53  Date & Time Notied:  10.24.18  0424  Provider Notified: Pola CornELINK Nurse  Orders Received/Actions taken: Awaiting instruction

## 2017-05-31 NOTE — Procedures (Signed)
Called by RN to come assess ventilator.  Per report, patient was rotated with the bed and the ETT became dislodged.  Upon entering room, ETT was noted to be 5 cm out with no volumes being returned on the ventilator.  Unable to advance ETT safely.  Tube was pulled the remainder of the way and patient was manually ventilated with the BVM.  Sats maintained in the mid to upper 90's throughout the process.  MD at bedside, patient placed on NRB ABG drawn and results reported.  Vent to remain in room for possible NIV but family does not wish her to be reintubated per MD.

## 2017-06-01 DIAGNOSIS — Z515 Encounter for palliative care: Secondary | ICD-10-CM

## 2017-06-01 DIAGNOSIS — J9601 Acute respiratory failure with hypoxia: Secondary | ICD-10-CM

## 2017-06-02 LAB — CULTURE, BLOOD (ROUTINE X 2)
CULTURE: NO GROWTH
SPECIAL REQUESTS: ADEQUATE

## 2017-06-08 NOTE — Discharge Summary (Signed)
Death Summary  Yvonne Fields ZOX:[id number] DOB: 1950/08/11 DOA: 06/21/2017  PCP: No primary care provider on file.  Admit date: June 21, 2017 Date of Death: 2017/06/28 Time of Death: 12:35 Notification: No primary care provider on file. 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 TEE showed 1 cm mass suggestive of fibroelastic stroma versus vegetation Developed hypoxic respiratory failure subsequently requiring intubation and chest x-ray showed left lung collapse bronchoscopy revealed copious secretions Infectious disease, palliative care, PCC involved in her care up until 10/24 Patient was made DNR/DNI subsequently and transferred to MedSurg floor for end-of-life discussions and care  Started on mophine GTt and expired 12:35   The results of significant diagnostics from this hospitalization (including imaging, microbiology, ancillary and laboratory) are listed below for reference.    Significant Diagnostic Studies: Ct Head Wo Contrast  Result Date: 05/30/2017 CLINICAL DATA:  Altered level of consciousness. EXAM: CT HEAD WITHOUT CONTRAST TECHNIQUE: Contiguous axial images were obtained from the base of the skull through the vertex without intravenous contrast. COMPARISON:  MRI brain 05/25/2017 FINDINGS: Brain: Previously noted white matter infarcts are again seen. No new infarct is present. No significant cortical infarct is present. The basal ganglia are intact. The insular ribbon is normal bilaterally. The ventricles are of normal size. No significant extra-axial fluid collection is present. Vascular: Atherosclerotic  calcifications are present in the cavernous internal carotid artery is bilaterally. There is no hyperdense vessel. Skull: The calvarium is intact. No focal lytic or blastic lesions are present. Sinuses/Orbits: The paranasal sinuses and mastoid air cells are clear. A left lens replacement is present. The globes and orbits are otherwise normal. Other: The patient is intubated. IMPRESSION: 1. 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 2. No significant new infarcts or change Electronically Signed   By: Marin Roberts M.D.   On: 05/30/2017 15:06   Ct Chest W Contrast  Result Date: 05/30/2017 CLINICAL DATA:  Leukocytosis.  Fever. EXAM: CT CHEST, ABDOMEN, AND PELVIS WITH CONTRAST TECHNIQUE: Multidetector CT imaging of the chest, abdomen and pelvis was performed following the standard protocol during bolus administration of intravenous contrast. CONTRAST:  80mL ISOVUE-300 IOPAMIDOL (ISOVUE-300) INJECTION 61% COMPARISON:  CT scan of abdomen and pelvis of April 01, 2017. FINDINGS: CT CHEST FINDINGS Cardiovascular: Endotracheal tube is in grossly good position. Atherosclerosis of thoracic aorta is noted without aneurysm or dissection. No pericardial effusion is noted. Mediastinum/Nodes: Nasogastric tube is seen passing through esophagus into stomach. Thyroid gland is unremarkable. No mediastinal adenopathy is noted. Lungs/Pleura: Emphysematous disease is noted in the upper lobes bilaterally. Significantly increased airspace opacity is noted in right lower lobe concerning for pneumonia. No pneumothorax or pleural effusion is noted. Musculoskeletal: No chest wall mass or suspicious bone lesions identified. CT ABDOMEN PELVIS FINDINGS Hepatobiliary: Findings consistent with hepatic cirrhosis without focal abnormality. No gallstones are noted. Mild amount of fluid is noted around the liver. Pancreas: Unremarkable. No pancreatic ductal dilatation or surrounding inflammatory changes. Spleen: Normal in size  without focal abnormality. Adrenals/Urinary Tract: Adrenal glands and kidneys are unremarkable. No hydronephrosis or renal obstruction is noted. Foley catheter is noted in the urinary bladder. Stomach/Bowel: Nasogastric tube tip is seen in distal stomach. There  is no evidence of bowel obstruction or inflammation. Vascular/Lymphatic: Aortic atherosclerosis. No enlarged abdominal or pelvic lymph nodes. Reproductive: Uterus and bilateral adnexa are unremarkable. Other: Mild ascites is noted.  No hernia is noted. Musculoskeletal: No acute or significant osseous findings. IMPRESSION: Atherosclerosis of thoracic and abdominal aorta is noted without aneurysm or dissection. Endotracheal and nasogastric tubes are in grossly good position. Significantly increased airspace opacity is noted in right lung base concerning for pneumonia. Emphysematous disease is noted in both upper lobes. Findings consistent with hepatic cirrhosis with mild associated ascites. Electronically Signed   By: Lupita Raider, M.D.   On: 05/30/2017 15:12   Ct Abdomen Pelvis W Contrast  Result Date: 05/30/2017 CLINICAL DATA:  Leukocytosis.  Fever. EXAM: CT CHEST, ABDOMEN, AND PELVIS WITH CONTRAST TECHNIQUE: Multidetector CT imaging of the chest, abdomen and pelvis was performed following the standard protocol during bolus administration of intravenous contrast. CONTRAST:  80mL ISOVUE-300 IOPAMIDOL (ISOVUE-300) INJECTION 61% COMPARISON:  CT scan of abdomen and pelvis of April 01, 2017. FINDINGS: CT CHEST FINDINGS Cardiovascular: Endotracheal tube is in grossly good position. Atherosclerosis of thoracic aorta is noted without aneurysm or dissection. No pericardial effusion is noted. Mediastinum/Nodes: Nasogastric tube is seen passing through esophagus into stomach. Thyroid gland is unremarkable. No mediastinal adenopathy is noted. Lungs/Pleura: Emphysematous disease is noted in the upper lobes bilaterally. Significantly increased airspace opacity is  noted in right lower lobe concerning for pneumonia. No pneumothorax or pleural effusion is noted. Musculoskeletal: No chest wall mass or suspicious bone lesions identified. CT ABDOMEN PELVIS FINDINGS Hepatobiliary: Findings consistent with hepatic cirrhosis without focal abnormality. No gallstones are noted. Mild amount of fluid is noted around the liver. Pancreas: Unremarkable. No pancreatic ductal dilatation or surrounding inflammatory changes. Spleen: Normal in size without focal abnormality. Adrenals/Urinary Tract: Adrenal glands and kidneys are unremarkable. No hydronephrosis or renal obstruction is noted. Foley catheter is noted in the urinary bladder. Stomach/Bowel: Nasogastric tube tip is seen in distal stomach. There is no evidence of bowel obstruction or inflammation. Vascular/Lymphatic: Aortic atherosclerosis. No enlarged abdominal or pelvic lymph nodes. Reproductive: Uterus and bilateral adnexa are unremarkable. Other: Mild ascites is noted.  No hernia is noted. Musculoskeletal: No acute or significant osseous findings. IMPRESSION: Atherosclerosis of thoracic and abdominal aorta is noted without aneurysm or dissection. Endotracheal and nasogastric tubes are in grossly good position. Significantly increased airspace opacity is noted in right lung base concerning for pneumonia. Emphysematous disease is noted in both upper lobes. Findings consistent with hepatic cirrhosis with mild associated ascites. Electronically Signed   By: Lupita Raider, M.D.   On: 05/30/2017 15:12   Dg Chest Port 1 View  Result Date: 05/30/2017 CLINICAL DATA:  Respiratory failure. EXAM: PORTABLE CHEST 1 VIEW COMPARISON:  05/29/2017. FINDINGS: Endotracheal tube, NG tube, right PICC line stable position. Heart size normal. Low lung volumes. Persistent but improving bilateral pulmonary infiltrates/edema. No pleural effusion or pneumothorax. IMPRESSION: 1. Lines and tubes in stable position. 2. Low lung volumes. Persistent but  improving bilateral pulmonary infiltrates/ edema. Electronically Signed   By: Maisie Fus  Register   On: 05/30/2017 06:45   Dg Chest Port 1 View  Result Date: 05/29/2017 CLINICAL DATA:  Hypoxia EXAM: PORTABLE CHEST 1 VIEW COMPARISON:  May 28, 2017 FINDINGS: Endotracheal tube tip is 6.2 cm above the carina. Central catheter tip is in the superior vena cava. Nasogastric tube tip and side port are below the diaphragm. No pneumothorax. There is somewhat less airspace consolidation in left mid lower lung zones  compared to 1 day prior. Consolidation remains in the right base region without appreciable change. There is no new opacity evident. Heart is upper normal in size with pulmonary vascularity within normal limits. There is aortic atherosclerosis. No adenopathy. No evident bone lesions. IMPRESSION: Tube and catheter positions as described without pneumothorax. Airspace consolidation bilaterally with partial clearing on the left and no change on the right. No new opacity. Heart upper normal in size. There is aortic atherosclerosis. Aortic Atherosclerosis (ICD10-I70.0). Electronically Signed   By: Bretta Bang III M.D.   On: 05/29/2017 07:11   Dg Chest Port 1 View  Result Date: 05/28/2017 CLINICAL DATA:  Respiratory failure EXAM: PORTABLE CHEST 1 VIEW COMPARISON:  05/27/2017 and prior radiographs FINDINGS: An endotracheal tube with tip 4.5 cm above the carina, NG tube entering the stomach with tip off the field of view and right PICC line with tip overlying the mid SVC noted. Bilateral airspace opacities are stable to minimally decreased. There is no evidence of pneumothorax. No other significant changes identified. IMPRESSION: Stable to minimally decreased bilateral airspace opacities. Electronically Signed   By: Harmon Pier M.D.   On: 05/28/2017 07:09   Dg Chest Port 1 View  Result Date: 05/27/2017 CLINICAL DATA:  Embolic stroke and respiratory failure. EXAM: PORTABLE CHEST 1 VIEW COMPARISON:   05/15/2017 and prior radiographs FINDINGS: Endotracheal tube images not significantly changed with tip 4.5 cm above the carina. An NG tube enters the stomach with tip off the field of view. A right PICC line is present with tip overlying the superior cavoatrial junction. Decreased lung volumes with slightly increased bilateral airspace opacities. No evidence of pneumothorax. IMPRESSION: 1. Support apparatus as described. Endotracheal tube with tip 4.5 cm above the carina. 2. Decreased lung volumes with slightly increased bilateral airspace opacities. Electronically Signed   By: Harmon Pier M.D.   On: 05/27/2017 06:58   Dg Chest Port 1 View  Result Date: 05/28/2017 CLINICAL DATA:  Respiratory failure. EXAM: PORTABLE CHEST 1 VIEW COMPARISON:  06/05/2017. FINDINGS: Endotracheal tube and right PICC line stable position. Interim placement NG tube, its tip is below left hemidiaphragm. Heart size stable. Interim improved aeration left lung with resolution of left upper and lower lobe atelectasis. Multifocal bilateral pulmonary infiltrates/edema again noted. No pneumothorax. IMPRESSION: 1. Interim placement of NG tube, its tip is below left hemidiaphragm. Endotracheal tube and right PICC line stable position. 2. Interim resolution of atelectatic changes throughout the left lung. 3.  Persistent multifocal bilateral pulmonary infiltrates/edema . Electronically Signed   By: Maisie Fus  Register   On: 05/08/2017 17:03   Dg Abd Portable 1v  Result Date: 05/29/2017 CLINICAL DATA:  Ileus. EXAM: PORTABLE ABDOMEN - 1 VIEW COMPARISON:  Abdominal x-ray dated May 25, 2017. FINDINGS: Enteric tube in place within a distended stomach. Paucity of bowel gas. No acute osseous abnormality. IMPRESSION: Paucity of bowel gas, nonspecific. Electronically Signed   By: Obie Dredge M.D.   On: 05/29/2017 15:12    Microbiology: Recent Results (from the past 240 hour(s))  MRSA PCR Screening     Status: None   Collection Time:  05/11/2017  4:07 PM  Result Value Ref Range Status   MRSA by PCR NEGATIVE NEGATIVE Final    Comment:        The GeneXpert MRSA Assay (FDA approved for NASAL specimens only), is one component of a comprehensive MRSA colonization surveillance program. It is not intended to diagnose MRSA infection nor to guide or monitor treatment for MRSA infections.  Culture, respiratory (NON-Expectorated)     Status: None   Collection Time: Jun 22, 2017  5:10 PM  Result Value Ref Range Status   Specimen Description TRACHEAL ASPIRATE  Final   Special Requests NONE  Final   Gram Stain   Final    ABUNDANT WBC PRESENT,BOTH PMN AND MONONUCLEAR RARE GRAM POSITIVE COCCI IN PAIRS RARE BUDDING YEAST SEEN RARE SQUAMOUS EPITHELIAL CELLS PRESENT    Culture RARE Consistent with normal respiratory flora.  Final   Report Status 05/29/2017 FINAL  Final  Culture, blood (routine x 2)     Status: Abnormal   Collection Time: 06/22/17  6:37 PM  Result Value Ref Range Status   Specimen Description BLOOD LEFT WRIST  Final   Special Requests IN PEDIATRIC BOTTLE Blood Culture adequate volume  Final   Culture  Setup Time   Final    GRAM POSITIVE COCCI IN CLUSTERS IN PEDIATRIC BOTTLE CRITICAL RESULT CALLED TO, READ BACK BY AND VERIFIED WITH: A MASTERS,PHARMD AT 1556 05/27/17 BY L BENFIELD    Culture STAPHYLOCOCCUS AUREUS (A)  Final   Report Status 05/29/2017 FINAL  Final   Organism ID, Bacteria STAPHYLOCOCCUS AUREUS  Final      Susceptibility   Staphylococcus aureus - MIC*    CIPROFLOXACIN <=0.5 SENSITIVE Sensitive     ERYTHROMYCIN <=0.25 SENSITIVE Sensitive     GENTAMICIN <=0.5 SENSITIVE Sensitive     OXACILLIN <=0.25 SENSITIVE Sensitive     TETRACYCLINE <=1 SENSITIVE Sensitive     VANCOMYCIN <=0.5 SENSITIVE Sensitive     TRIMETH/SULFA <=10 SENSITIVE Sensitive     CLINDAMYCIN <=0.25 SENSITIVE Sensitive     RIFAMPIN <=0.5 SENSITIVE Sensitive     Inducible Clindamycin NEGATIVE Sensitive     * STAPHYLOCOCCUS AUREUS   Culture, blood (routine x 2)     Status: Abnormal   Collection Time: 22-Jun-2017  6:37 PM  Result Value Ref Range Status   Specimen Description BLOOD LEFT HAND  Final   Special Requests   Final    IN PEDIATRIC BOTTLE Blood Culture results may not be optimal due to an inadequate volume of blood received in culture bottles   Culture  Setup Time   Final    GRAM POSITIVE COCCI IN PEDIATRIC BOTTLE CRITICAL VALUE NOTED.  VALUE IS CONSISTENT WITH PREVIOUSLY REPORTED AND CALLED VALUE.    Culture (A)  Final    STAPHYLOCOCCUS AUREUS SUSCEPTIBILITIES PERFORMED ON PREVIOUS CULTURE WITHIN THE LAST 5 DAYS.    Report Status 05/29/2017 FINAL  Final  Blood Culture ID Panel (Reflexed)     Status: Abnormal   Collection Time: 2017-06-22  6:37 PM  Result Value Ref Range Status   Enterococcus species NOT DETECTED NOT DETECTED Final   Listeria monocytogenes NOT DETECTED NOT DETECTED Final   Staphylococcus species DETECTED (A) NOT DETECTED Final    Comment: CRITICAL RESULT CALLED TO, READ BACK BY AND VERIFIED WITH: A MASTERS,PHARMD AT 1556 05/27/17 BY L BENFIELD    Staphylococcus aureus DETECTED (A) NOT DETECTED Final    Comment: Methicillin (oxacillin) susceptible Staphylococcus aureus (MSSA). Preferred therapy is anti staphylococcal beta lactam antibiotic (Cefazolin or Nafcillin), unless clinically contraindicated. CRITICAL RESULT CALLED TO, READ BACK BY AND VERIFIED WITH: A MASTERS,PHARMD AT 1556 05/27/17 BY L BENFIELD    Methicillin resistance NOT DETECTED NOT DETECTED Final   Streptococcus species NOT DETECTED NOT DETECTED Final   Streptococcus agalactiae NOT DETECTED NOT DETECTED Final   Streptococcus pneumoniae NOT DETECTED NOT DETECTED Final   Streptococcus pyogenes NOT DETECTED NOT DETECTED  Final   Acinetobacter baumannii NOT DETECTED NOT DETECTED Final   Enterobacteriaceae species NOT DETECTED NOT DETECTED Final   Enterobacter cloacae complex NOT DETECTED NOT DETECTED Final   Escherichia coli  NOT DETECTED NOT DETECTED Final   Klebsiella oxytoca NOT DETECTED NOT DETECTED Final   Klebsiella pneumoniae NOT DETECTED NOT DETECTED Final   Proteus species NOT DETECTED NOT DETECTED Final   Serratia marcescens NOT DETECTED NOT DETECTED Final   Haemophilus influenzae NOT DETECTED NOT DETECTED Final   Neisseria meningitidis NOT DETECTED NOT DETECTED Final   Pseudomonas aeruginosa NOT DETECTED NOT DETECTED Final   Candida albicans NOT DETECTED NOT DETECTED Final   Candida glabrata NOT DETECTED NOT DETECTED Final   Candida krusei NOT DETECTED NOT DETECTED Final   Candida parapsilosis NOT DETECTED NOT DETECTED Final   Candida tropicalis NOT DETECTED NOT DETECTED Final  Culture, blood (Routine X 2) w Reflex to ID Panel     Status: None   Collection Time: 05/28/17 11:38 AM  Result Value Ref Range Status   Specimen Description BLOOD BLOOD LEFT HAND  Final   Special Requests   Final    BOTTLES DRAWN AEROBIC AND ANAEROBIC Blood Culture adequate volume   Culture NO GROWTH 5 DAYS  Final   Report Status 06/02/2017 FINAL  Final     Labs: Basic Metabolic Panel:  Recent Labs Lab 05/27/17 0425 05/28/17 0450 05/28/17 1130 05/29/17 0346 05/29/17 1749 05/30/17 0356 05/31/17 0402  NA 138 140 139 142 142 142 139  K 3.5 2.8* 4.9 5.2* 4.4 4.3 4.7  CL 111 107 112* 113* 111 111 105  CO2 17* 26 23 23 23  21* 19*  GLUCOSE 139* 286* 191* 165* 169* 165* 153*  BUN 50* 37* 37* 36* 37* 50* 78*  CREATININE 1.31* 1.06* 1.10* 0.91 0.89 1.22* 2.02*  CALCIUM 8.0* 7.1* 7.4* 7.7* 7.7* 7.3* 6.8*  MG 2.5* 1.9  --  2.0  --  1.7 2.3  PHOS 3.0 2.1*  --  1.8*  --  6.1* 7.9*   Liver Function Tests:  Recent Labs Lab 05/27/17 0425 05/28/17 0450 05/31/17 0402  AST 53* 99* 74*  ALT 25 30 6*  ALKPHOS 145* 134* 142*  BILITOT 1.6* 1.6* 7.9*  PROT 6.5 5.7* 6.8  ALBUMIN 1.6* 1.5* 1.4*   No results for input(s): LIPASE, AMYLASE in the last 168 hours. No results for input(s): AMMONIA in the last 168  hours. CBC:  Recent Labs Lab 05/28/17 0450 05/28/17 1130 05/29/17 0346 05/30/17 0356 05/31/17 0402  WBC 26.2* 31.2* 36.1* 40.7* 51.3*  NEUTROABS 22.0*  --  31.0* 33.8* 44.1*  HGB 7.6* 8.1* 8.4* 8.5* 8.1*  HCT 23.1* 24.6* 25.8* 26.0* 24.3*  MCV 83.4 83.4 84.9 85.8 85.0  PLT 18* 22* 28* 67* 100*   Cardiac Enzymes: No results for input(s): CKTOTAL, CKMB, CKMBINDEX, TROPONINI in the last 168 hours. D-Dimer No results for input(s): DDIMER in the last 72 hours. BNP: Invalid input(s): POCBNP CBG:  Recent Labs Lab 05/31/17 0359 05/31/17 0739 05/31/17 1135 05/31/17 1311 05/31/17 1536  GLUCAP 147* 129* 116* 116* 124*   Anemia work up No results for input(s): VITAMINB12, FOLATE, FERRITIN, TIBC, IRON, RETICCTPCT in the last 72 hours. Urinalysis    Component Value Date/Time   COLORURINE YELLOW 2017-06-18 1735   APPEARANCEUR CLEAR 06-18-17 1735   LABSPEC 1.013 06/18/17 1735   PHURINE 5.0 06-18-2017 1735   GLUCOSEU NEGATIVE 2017-06-18 1735   HGBUR SMALL (A) 06-18-17 1735   BILIRUBINUR NEGATIVE 06/18/17 1735  KETONESUR NEGATIVE 05/24/2017 1735   PROTEINUR NEGATIVE 05/10/2017 1735   NITRITE NEGATIVE 05/22/2017 1735   LEUKOCYTESUR TRACE (A) 05/25/2017 1735   Sepsis Labs Invalid input(s): PROCALCITONIN,  WBC,  LACTICIDVEN     SIGNED:  Rhetta MuraSAMTANI, JAI-GURMUKH, MD  [facility] 06/02/2017, 4:43 PM Pager   If 7PM-7AM, please contact night-coverage [email or web address] Password [email or web address]

## 2017-06-08 NOTE — Progress Notes (Signed)
Daily Progress Note   Patient Name: Ethlyn GalleryJanice Marie Konicki       Date: 05/28/2017 DOB: Aug 07, 1951  Age: 66 y.o. MRN#: [id number] Attending Physician: Rhetta MuraSamtani, Jai-Gurmukh, MD Primary Care Physician: No primary care provider on file. Admit Date: 2017-05-12  Reason for Consultation/Follow-up: Terminal Care  Subjective: Liborio NixonJanice was transitioned to comfort measures yesterday afternoon. Her daughter, at the bedside, feels she has been very comfortable overnight and this morning. Her main concern is ensuring her mother remains this comfortable and peaceful.   Length of Stay: 6  Current Medications: Scheduled Meds:  . chlorhexidine  15 mL Mouth Rinse BID  . glycopyrrolate  0.4 mg Intravenous TID  . LORazepam  0.5 mg Intravenous Q6H  . mouth rinse  15 mL Mouth Rinse q12n4p    Continuous Infusions: . morphine 2 mg/hr (05/31/17 1733)    PRN Meds: acetaminophen, antiseptic oral rinse, glycopyrrolate **OR** glycopyrrolate **OR** glycopyrrolate, haloperidol **OR** haloperidol **OR** haloperidol lactate, ipratropium-albuterol, LORazepam **OR** LORazepam **OR** LORazepam, morphine, polyvinyl alcohol, sodium chloride flush  Physical Exam  Constitutional: No distress.  HENT:  Head: Normocephalic and atraumatic.  Mouth and lips dry. Mouth breathing.   Eyes:  Eyes closed.  Pulmonary/Chest: Effort normal.  No gargling of secretions. Breathing rhythmic with no periods of apnea and unlabored.  Musculoskeletal:  Not moving in bed. Edema in BUE with R>L. Marked muscle wasting in BLE.  Neurological:  Unresponsive to verbal and physical stimuli.   Skin:  Feet cold, hands remain warm. Extensive bruising in BUE.  Psychiatric:  Appears comfortable and calm in bed.            Vital Signs: BP (!) 99/42 (BP Location: Left Arm)   Pulse (!) 104   Temp  (!) 97.5 F (36.4 C) (Axillary)   Resp 14   Ht 5\' 3"  (1.6 m)   Wt 58.6 kg (129 lb 3 oz)   SpO2 (!) 72%   BMI 22.88 kg/m  SpO2: SpO2: (!) 72 % O2 Device: O2 Device: Not Delivered O2 Flow Rate: O2 Flow Rate (L/min): 6 L/min  Intake/output summary:  Intake/Output Summary (Last 24 hours) at 05/24/2017 1022 Last data filed at 05/17/2017 [id number]  Gross per 24 hour  Intake           274.53 ml  Output              300 ml  Net           -25.47 ml   LBM: Last BM Date: 05/31/17 Baseline Weight: Weight: 52.4 kg (115 lb 8.3 oz) Most recent weight: Weight: 58.6 kg (129 lb 3 oz)  Palliative Assessment/Data: PPS 10%   Flowsheet Rows     Most Recent Value  Intake Tab  Referral Department  Critical care  Unit at Time of Referral  ICU  Palliative Care Primary Diagnosis  Neurology  Date Notified  05/31/17  Palliative Care Type  New Palliative care  Reason for referral  Clarify Goals of Care  Date of Admission  2017/04/24  Date first seen by Palliative Care  05/31/17  # of days Palliative referral response time  0 Day(s)  # of days IP prior to Palliative referral  5  Clinical  Assessment  Palliative Performance Scale Score  10%  Psychosocial & Spiritual Assessment  Palliative Care Outcomes  Patient/Family meeting held?  Yes  Palliative Care Outcomes  Improved pain interventions, Improved non-pain symptom therapy, Clarified goals of care, Counseled regarding hospice, Provided end of life care assistance, Changed to focus on comfort  Patient/Family wishes: Interventions discontinued/not started   Mechanical Ventilation, Antibiotics, Tube feedings/TPN, Transfer out of ICU      Patient Active Problem List   Diagnosis Date Noted  . Ventilator dependent (HCC)   . Cerebral septic emboli (HCC)   . Comfort measures only status   . Palliative care encounter   . Ileus (HCC)   . Distended abdomen   . 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 Severe sepsis with acute organ dysfunction due to methicillin susceptible  Staphylococcus aureus (MSSA) (HCC) 05/28/2017  . MSSA bacteremia 05/28/2017  . Thrombocytopenia (HCC) 05/28/2017  . Leukocytosis 05/28/2017  . Anemia 05/28/2017  . Respiratory failure (HCC) 05/28/2017  . 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 female  with past medical history of COPD, GERD, hepatitis C, CHF, recent UTI, and pubic rami fracture admitted on 06/21/2017 with N/V/D and abdominal pain x10 days. Admitted for dehydration and AKI. After admission, she had CVA - TPA not given d/t unknown time and INR >2. MRI of the brain revealed multiple areas of infarction consistent with embolic disease. 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 After the TEE, she was hypoxemic requiring intubation.Chest x-ray at that time demonstrated left lung collapse. She underwent a bronchoscopy during which copious secretions were removed including some plugs. Overnight, she self-extubated and family elected to not reintubate her. Palliative care consulted for goals of care discussion.  Assessment: Jerrianne was transitioned to full comfort measures yesterday evening after a good conversation with her daughter and son. She was started on comfort medications and transitioned to 6N.  Today, she appears quite comfortable and peaceful. She is on a morphine drip with both scheduled and PRN comfort medications available. She is clearly transitioning towards death, as noted with her down-trending vital signs and the physical changes of cooling extremities and somnolence. I discussed the option of transitioning to a residential hospice house, which her daughter was open to. We agreed to reassess around 1300 and ensure stability and ongoing comfort.   Recommendations/Plan:  Continue full comfort measures. No medication changes needed at present.  Plan to re-assess at 1300, if still stable for transport will proceed with residential  Hospice. Wll place the SW consult now to facilitate referal process and evaluate for bed availability  Goals of Care and Additional Recommendations:  Limitations on Scope of Treatment: Full Comfort Care  Code Status:  DNR  Prognosis:   Hours - Days  Discharge Planning:  Hospice facility  Care plan was discussed with pt's daughter at bedside.   Thank you for allowing the Palliative Medicine Team to assist in the care of this patient.  Total time: 35 minutes    Greater than 50%  of this time was spent counseling and coordinating care related to the above assessment and plan.  Murrell Converse, NP Palliative Medicine Team [phone] pager (7a-5p) Team Phone # [phone]

## 2017-06-08 NOTE — Progress Notes (Signed)
Nutrition Brief Note  Chart reviewed. Pt now transitioning to comfort care.  No further nutrition interventions warranted at this time.  Please re-consult as needed.    Dhyan Noah, RD, LDN, CNSC Pager [phone] After Hours Pager [phone]    

## 2017-06-08 NOTE — Progress Notes (Signed)
Patient DNR, unresponsive, no pulse, no respirations, pooling of blood in feet and hands, her daughter was at bedside at time of death (11-20-16 @12 :35, [facility] also notified and is not a donor candidate.

## 2017-06-08 NOTE — Progress Notes (Signed)
Patient's body transported to the morgue. 

## 2017-06-08 NOTE — Progress Notes (Signed)
Yvonne Fields ZOX:[id number] DOB: 04/15/1951 DOA: 05/27/2017 PCP: No primary care provider on file.  Brief narrative: 66 year old female known history of COPD, GERD, hep C, CHF and pubic rami fractures 04/2017 Admitted to critical care medicine after being seen at [facility] 10/17-was transferred because of right-sided facial droop with associated weakness--- was not given TPA given unknown time last well MRI brain revealed multiple areas of infarct TEE showed 1 cm mass suggestive of fibroelastic stroma versus vegetation Developed hypoxic respiratory failure subsequently requiring intubation and chest x-ray showed left lung collapse bronchoscopy revealed copious secretions Infectious disease, palliative care, PCC involved in her care up until 10/24 Patient was made DNR/DNI subsequently and transferred to MedSurg floor for end-of-life discussions and care   Past medical history-As per Problem list Chart reviewed as below-   STUDIES:  MRI Brain 10/18 > multiple areas bilateral infarct consistent with embolic disease Carotid ultrasound > unremarkable Echo 10/18 > heterogeneous density on mitral valve suggestive of vegetation, myxoma, fibroelastoma, or organized thrombus.  TEE 10/19 > LVEF 50-55%, mild to mod mitral regurg unable to rule out veg.  DG Chest 05/27/2017>>Decreased lung volumes with slightly increased bilateral airspace Opacities. CXR 05/28/2017: Stable to minimally decreased bilateral airspace opacities. CT chest, A / P 10/23 > PNA right base, emphysematous changes bilaterally, hepatic cirrhosis with mild ascites. CT head 10/23 > stable scattered white matter infarcts.  CULTURES: Blood 10/18 >GS: Aerobic gram + cocci in clusters ( Drawn at [place]) Blood  10/19> Staph Aureus Blood 10/21 >  Sputum 10/19 > GS: ABUNDANT WBC PRESENT,BOTH PMN AND MONONUCLEAR  RARE GRAM POSITIVE COCCI IN PAIRS  RARE BUDDING YEAST SEEN 10/18: + POC MSSA ( [place])  Cultures at  [place] prior to transfer:  Urine c/s 10/17 negative Bronch c/s 19th - pending  ANTIBIOTICS: Zosyn 10/19 > 10/21 Vancomycin 10/19 > 10/21  Ancef 10/21 > 10/22 Nafcillin 10/22 >   SIGNIFICANT EVENTS: 10/17 admit for dehydration AKI 10/18 embolic CVA 10/19 ETT, intubated for hypoxia, and transfer to [facility] for mitral valve mass.  10/24 early AM > ETT dislodgement 10/24 transferred to med-surge  LINES/TUBES: ETT 10/19 > 10/24 PICC 10/19 >   Subjective  Sleeping comfortably at the bedside but increased work of breathing about 40 times a minute Daughter reports no new issues although had some bleeding at the site of the mouth  Objective    Interim History:   Telemetry: None telemetry   Objective: Vitals:   05/31/17 1900 05/31/17 2000 05/31/17 2100 05/31/17 2242  BP:    (!) 99/42  Pulse: (!) 110 (!) 103 (!) 104 (!) 104  Resp: 17 14 13 14   Temp:    (!) 97.5 F (36.4 C)  TempSrc:    Axillary  SpO2: (!) 76% (!) 74% (!) 73% (!) 72%  Weight:      Height:        Intake/Output Summary (Last 24 hours) at 06-28-17 0757 Last data filed at 06/28/2017 8119  Gross per 24 hour  Intake           394.53 ml  Output              375 ml  Net            19.53 ml    Exam:  Frail cachectic increased work of breathing, non-arousable pupils slightly dilated Tachycardic 150 range Abdomen soft nontender Abdomen soft Left lower extremity mottled and cool however right lower extremity warm  Data Reviewed: Basic Metabolic  Panel:  Recent Labs Lab 05/27/17 0425 05/28/17 0450 05/28/17 1130 05/29/17 0346 05/29/17 1749 05/30/17 0356 05/31/17 0402  NA 138 140 139 142 142 142 139  K 3.5 2.8* 4.9 5.2* 4.4 4.3 4.7  CL 111 107 112* 113* 111 111 105  CO2 17* 26 23 23 23  21* 19*  GLUCOSE 139* 286* 191* 165* 169* 165* 153*  BUN 50* 37* 37* 36* 37* 50* 78*  CREATININE 1.31* 1.06* 1.10* 0.91 0.89 1.22* 2.02*  CALCIUM 8.0* 7.1* 7.4* 7.7* 7.7* 7.3* 6.8*  MG 2.5* 1.9  --  2.0  --  1.7  2.3  PHOS 3.0 2.1*  --  1.8*  --  6.1* 7.9*   Liver Function Tests:  Recent Labs Lab 2016/10/29 1629 05/27/17 0425 05/28/17 0450 05/31/17 0402  AST 48* 53* 99* 74*  ALT 25 25 30  6*  ALKPHOS 129* 145* 134* 142*  BILITOT 1.1 1.6* 1.6* 7.9*  PROT 5.4* 6.5 5.7* 6.8  ALBUMIN 1.5* 1.6* 1.5* 1.4*   No results for input(s): LIPASE, AMYLASE in the last 168 hours. No results for input(s): AMMONIA in the last 168 hours. CBC:  Recent Labs Lab 2016/10/29 1629  05/28/17 0450 05/28/17 1130 05/29/17 0346 05/30/17 0356 05/31/17 0402  WBC 22.7*  < > 26.2* 31.2* 36.1* 40.7* 51.3*  NEUTROABS 19.7*  --  22.0*  --  31.0* 33.8* 44.1*  HGB 6.4*  < > 7.6* 8.1* 8.4* 8.5* 8.1*  HCT 20.0*  < > 23.1* 24.6* 25.8* 26.0* 24.3*  MCV 83.7  < > 83.4 83.4 84.9 85.8 85.0  PLT 33*  < > 18* 22* 28* 67* 100*  < > = values in this interval not displayed. Cardiac Enzymes: No results for input(s): CKTOTAL, CKMB, CKMBINDEX, TROPONINI in the last 168 hours. BNP: Invalid input(s): POCBNP CBG:  Recent Labs Lab 05/31/17 0359 05/31/17 0739 05/31/17 1135 05/31/17 1311 05/31/17 1536  GLUCAP 147* 129* 116* 116* 124*    Recent Results (from the past 240 hour(s))  MRSA PCR Screening     Status: None   Collection Time: 2016/10/29  4:07 PM  Result Value Ref Range Status   MRSA by PCR NEGATIVE NEGATIVE Final    Comment:        The GeneXpert MRSA Assay (FDA approved for NASAL specimens only), is one component of a comprehensive MRSA colonization surveillance program. It is not intended to diagnose MRSA infection nor to guide or monitor treatment for MRSA infections.   Culture, respiratory (NON-Expectorated)     Status: None   Collection Time: 2016/10/29  5:10 PM  Result Value Ref Range Status   Specimen Description TRACHEAL ASPIRATE  Final   Special Requests NONE  Final   Gram Stain   Final    ABUNDANT WBC PRESENT,BOTH PMN AND MONONUCLEAR RARE GRAM POSITIVE COCCI IN PAIRS RARE BUDDING YEAST SEEN RARE  SQUAMOUS EPITHELIAL CELLS PRESENT    Culture RARE Consistent with normal respiratory flora.  Final   Report Status 05/29/2017 FINAL  Final  Culture, blood (routine x 2)     Status: Abnormal   Collection Time: 2016/10/29  6:37 PM  Result Value Ref Range Status   Specimen Description BLOOD LEFT WRIST  Final   Special Requests IN PEDIATRIC BOTTLE Blood Culture adequate volume  Final   Culture  Setup Time   Final    GRAM POSITIVE COCCI IN CLUSTERS IN PEDIATRIC BOTTLE CRITICAL RESULT CALLED TO, READ BACK BY AND VERIFIED WITH: A MASTERS,PHARMD AT 1556 05/27/17 BY L BENFIELD  Culture STAPHYLOCOCCUS AUREUS (A)  Final   Report Status 05/29/2017 FINAL  Final   Organism ID, Bacteria STAPHYLOCOCCUS AUREUS  Final      Susceptibility   Staphylococcus aureus - MIC*    CIPROFLOXACIN <=0.5 SENSITIVE Sensitive     ERYTHROMYCIN <=0.25 SENSITIVE Sensitive     GENTAMICIN <=0.5 SENSITIVE Sensitive     OXACILLIN <=0.25 SENSITIVE Sensitive     TETRACYCLINE <=1 SENSITIVE Sensitive     VANCOMYCIN <=0.5 SENSITIVE Sensitive     TRIMETH/SULFA <=10 SENSITIVE Sensitive     CLINDAMYCIN <=0.25 SENSITIVE Sensitive     RIFAMPIN <=0.5 SENSITIVE Sensitive     Inducible Clindamycin NEGATIVE Sensitive     * STAPHYLOCOCCUS AUREUS  Culture, blood (routine x 2)     Status: Abnormal   Collection Time: 05/20/2017  6:37 PM  Result Value Ref Range Status   Specimen Description BLOOD LEFT HAND  Final   Special Requests   Final    IN PEDIATRIC BOTTLE Blood Culture results may not be optimal due to an inadequate volume of blood received in culture bottles   Culture  Setup Time   Final    GRAM POSITIVE COCCI IN PEDIATRIC BOTTLE CRITICAL VALUE NOTED.  VALUE IS CONSISTENT WITH PREVIOUSLY REPORTED AND CALLED VALUE.    Culture (A)  Final    STAPHYLOCOCCUS AUREUS SUSCEPTIBILITIES PERFORMED ON PREVIOUS CULTURE WITHIN THE LAST 5 DAYS.    Report Status 05/29/2017 FINAL  Final  Blood Culture ID Panel (Reflexed)     Status:  Abnormal   Collection Time: 05/19/2017  6:37 PM  Result Value Ref Range Status   Enterococcus species NOT DETECTED NOT DETECTED Final   Listeria monocytogenes NOT DETECTED NOT DETECTED Final   Staphylococcus species DETECTED (A) NOT DETECTED Final    Comment: CRITICAL RESULT CALLED TO, READ BACK BY AND VERIFIED WITH: A MASTERS,PHARMD AT 1556 05/27/17 BY L BENFIELD    Staphylococcus aureus DETECTED (A) NOT DETECTED Final    Comment: Methicillin (oxacillin) susceptible Staphylococcus aureus (MSSA). Preferred therapy is anti staphylococcal beta lactam antibiotic (Cefazolin or Nafcillin), unless clinically contraindicated. CRITICAL RESULT CALLED TO, READ BACK BY AND VERIFIED WITH: A MASTERS,PHARMD AT 1556 05/27/17 BY L BENFIELD    Methicillin resistance NOT DETECTED NOT DETECTED Final   Streptococcus species NOT DETECTED NOT DETECTED Final   Streptococcus agalactiae NOT DETECTED NOT DETECTED Final   Streptococcus pneumoniae NOT DETECTED NOT DETECTED Final   Streptococcus pyogenes NOT DETECTED NOT DETECTED Final   Acinetobacter baumannii NOT DETECTED NOT DETECTED Final   Enterobacteriaceae species NOT DETECTED NOT DETECTED Final   Enterobacter cloacae complex NOT DETECTED NOT DETECTED Final   Escherichia coli NOT DETECTED NOT DETECTED Final   Klebsiella oxytoca NOT DETECTED NOT DETECTED Final   Klebsiella pneumoniae NOT DETECTED NOT DETECTED Final   Proteus species NOT DETECTED NOT DETECTED Final   Serratia marcescens NOT DETECTED NOT DETECTED Final   Haemophilus influenzae NOT DETECTED NOT DETECTED Final   Neisseria meningitidis NOT DETECTED NOT DETECTED Final   Pseudomonas aeruginosa NOT DETECTED NOT DETECTED Final   Candida albicans NOT DETECTED NOT DETECTED Final   Candida glabrata NOT DETECTED NOT DETECTED Final   Candida krusei NOT DETECTED NOT DETECTED Final   Candida parapsilosis NOT DETECTED NOT DETECTED Final   Candida tropicalis NOT DETECTED NOT DETECTED Final  Culture, blood  (Routine X 2) w Reflex to ID Panel     Status: None (Preliminary result)   Collection Time: 05/28/17 11:38 AM  Result Value Ref Range  Status   Specimen Description BLOOD BLOOD LEFT HAND  Final   Special Requests   Final    BOTTLES DRAWN AEROBIC AND ANAEROBIC Blood Culture adequate volume   Culture NO GROWTH 3 DAYS  Final   Report Status PENDING  Incomplete     Studies:              All Imaging reviewed and is as per above notation   Scheduled Meds: . chlorhexidine  15 mL Mouth Rinse BID  . glycopyrrolate  0.4 mg Intravenous TID  . LORazepam  0.5 mg Intravenous Q6H  . mouth rinse  15 mL Mouth Rinse q12n4p   Continuous Infusions: . morphine 2 mg/hr (05/31/17 1733)     Assessment/Plan:  1. 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 Acute respiratory failure with ETT dislodging and removed as well as right lower lobe pneumonia 3. Kidney injury  Patient is on palliative trajectory appreciate input from palliative care Continue morphine drip and as needed comfort meds--?need adjustment of meds for comfort further  Very guarded prognosis if survives 24 hours without significant change in physical state may consider hospice placement appreciate palliative care input  Pleas Koch, MD  [facility] Pager [phone] 06-26-17, 7:57 AM    LOS: 6 days

## 2017-06-08 DEATH — deceased

## 2019-02-26 IMAGING — CR DG CHEST 1V PORT
1 series · 1 of 1 positions shown · non-contrast
Comparison: 05/29/2017.

CLINICAL DATA: Respiratory failure.

EXAM:
PORTABLE CHEST 1 VIEW

[AP]
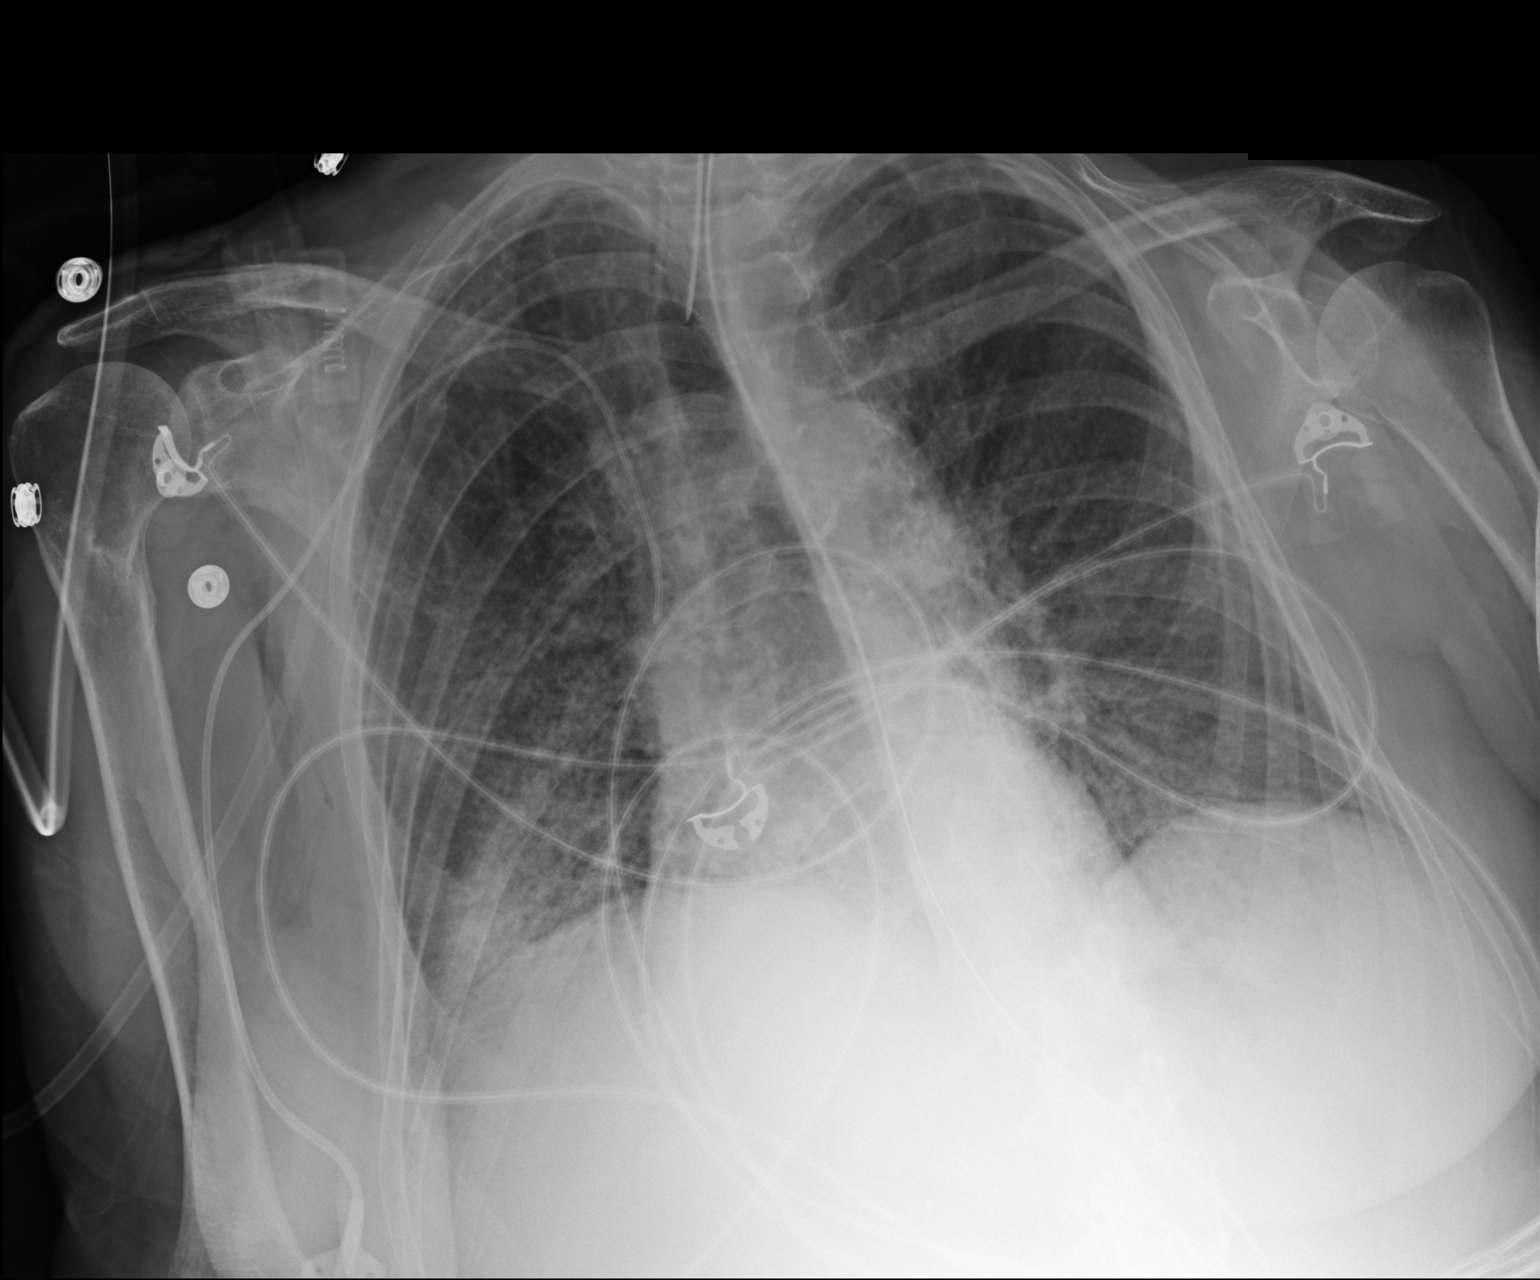

[1 of 1 positions shown; findings below may reference images not displayed]

FINDINGS: Endotracheal tube, NG tube, right PICC line stable position. Heart
size normal. Low lung volumes. Persistent but improving bilateral
pulmonary infiltrates/edema. No pleural effusion or pneumothorax.
IMPRESSION: 1. Lines and tubes in stable position.

2. Low lung volumes. Persistent but improving bilateral pulmonary
infiltrates/ edema.
